# Patient Record
Sex: Female | Born: 1965 | Race: White | Hispanic: No | Marital: Single | Smoking: Never smoker
Health system: Southern US, Community
[De-identification: ages and names within clinical notes are randomized; demographics above are authoritative.]

---

## 2007-08-05 ENCOUNTER — Emergency Department: Payer: Self-pay | Admitting: Emergency Medicine

## 2009-07-21 ENCOUNTER — Emergency Department: Payer: Self-pay | Admitting: Emergency Medicine

## 2009-07-22 ENCOUNTER — Ambulatory Visit: Payer: Self-pay | Admitting: Family Medicine

## 2012-04-21 ENCOUNTER — Emergency Department: Payer: Self-pay | Admitting: Unknown Physician Specialty

## 2017-12-27 ENCOUNTER — Encounter: Payer: Self-pay | Admitting: Internal Medicine

## 2017-12-27 ENCOUNTER — Inpatient Hospital Stay: Payer: Medicaid Other | Attending: Internal Medicine | Admitting: Internal Medicine

## 2017-12-27 ENCOUNTER — Other Ambulatory Visit: Payer: Self-pay | Admitting: *Deleted

## 2017-12-27 DIAGNOSIS — N920 Excessive and frequent menstruation with regular cycle: Secondary | ICD-10-CM | POA: Diagnosis not present

## 2017-12-27 DIAGNOSIS — D500 Iron deficiency anemia secondary to blood loss (chronic): Secondary | ICD-10-CM

## 2017-12-27 DIAGNOSIS — Z79899 Other long term (current) drug therapy: Secondary | ICD-10-CM | POA: Diagnosis not present

## 2017-12-27 LAB — URINALYSIS, COMPLETE (UACMP) WITH MICROSCOPIC
BACTERIA UA: NONE SEEN
BILIRUBIN URINE: NEGATIVE
Glucose, UA: NEGATIVE mg/dL
Hgb urine dipstick: NEGATIVE
KETONES UR: NEGATIVE mg/dL
LEUKOCYTES UA: NEGATIVE
Nitrite: NEGATIVE
Protein, ur: NEGATIVE mg/dL
RBC / HPF: NONE SEEN RBC/hpf (ref 0–5)
Specific Gravity, Urine: 1.002 — ABNORMAL LOW (ref 1.005–1.030)
WBC UA: NONE SEEN WBC/hpf (ref 0–5)
pH: 6 (ref 5.0–8.0)

## 2017-12-27 NOTE — Assessment & Plan Note (Addendum)
#  Iron deficiency anemia; hemoglobin 8.3 MCV 73 platelets 620; ferritin 5 [labs through PCP].  The etiology is unclear [see discussion below].  Recommend weekly Venofer x4.  Discussed the potential infusion reactions with Venofer.   #The etiology of iron deficiency is unclear-recommend GI evaluation; also recommend checking a UA-to rule out urinary causes.  Also check LDH heptoglobin reticulocyte count at next visit.-   #Recommend follow-up in 2 months; ldh; retic; cmp in 2 months; hapto; retic; Venofer possible.  Thank you Dr.Aycock for allowing me to participate in the care of your pleasant patient. Please do not hesitate to contact me with questions or concerns in the interim.  Cc: Dr.Aycock-

## 2017-12-27 NOTE — Progress Notes (Signed)
[name] CONSULT NOTE  Patient Care Team: Patient, No Pcp Per as PCP - General (General Practice)  CHIEF COMPLAINTS/PURPOSE OF CONSULTATION: Iron deficiency anemia  # Iron deficiency Anemia: [hb8.3/ ferritin- 5 /PCP]    No history exists.     HISTORY OF PRESENTING ILLNESS:  Jasmin Molina 52 y.o.  female  with no significant past medical history has been referred to [place] for further evaluation recommendations for iron deficiency anemia.  Patient states that she had anemia "for a long time".  This has previously been attributed to her heavy menstrual periods in the past.  Patient has been intolerant to p.o. iron causing abdominal discomfort.   Patient however more recently states her menstrual cycles have been irregular/infrequent.  She has had 2 menstrual cycles in the last 6 months.   Patient denies any blood in stools; she denies any black colored stools.  She does complain of fatigue.  She does have craving for ice.   ROS: A complete 10 point review of system is done which is negative except mentioned above in history of present illness  MEDICAL HISTORY:  History reviewed. No pertinent past medical history.  SURGICAL HISTORY: History reviewed. No pertinent surgical history.  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  Social History   Socioeconomic History  . Marital status: Single    Spouse name: Not on file  . Number of children: Not on file  . Years of education: Not on file  . Highest education level: Not on file  Occupational History  . Not on file  Social Needs  . Financial resource strain: Not hard at all  . Food insecurity:    Worry: Never true    Inability: Never true  . Transportation needs:    Medical: No    Non-medical: No  Tobacco Use  . Smoking status: Never Smoker  . Smokeless tobacco: Never Used  Substance and Sexual Activity  . Alcohol use: No    Frequency: Never  . Drug use: No  .  Sexual activity: Not on file  Lifestyle  . Physical activity:    Days per week: Not on file    Minutes per session: Not on file  . Stress: Rather much  Relationships  . Social connections:    Talks on phone: Not on file    Gets together: Not on file    Attends religious service: Not on file    Active member of club or organization: Not on file    Attends meetings of clubs or organizations: Not on file    Relationship status: Not on file  . Intimate partner violence:    Fear of current or ex partner: Not on file    Emotionally abused: Not on file    Physically abused: Not on file    Forced sexual activity: Not on file  Other Topics Concern  . Not on file  Social History Narrative  . Not on file    FAMILY HISTORY: no other cancers Family History  Problem Relation Age of Onset  . Cancer Maternal Uncle        Prostate cancer  . Cancer Paternal Uncle        Prostate cancer    ALLERGIES:  is allergic to codeine.  MEDICATIONS:  Current Outpatient Medications  Medication Sig Dispense Refill  . cyanocobalamin (,VITAMIN B-12,) 1000 MCG/ML injection Inject 1,000 mcg into the muscle once.    Marland Kitchen ibuprofen (ADVIL,MOTRIN) 800 MG tablet  Take 800 mg by mouth as needed.    Marland Kitchen. levothyroxine (SYNTHROID, LEVOTHROID) 150 MCG tablet Take 150 mcg by mouth daily before breakfast.     No current facility-administered medications for this visit.       Marland Kitchen.  PHYSICAL EXAMINATION: ECOG PERFORMANCE STATUS: 0 - Asymptomatic  Vitals:   12/27/17 1400  BP: 135/81  Pulse: 85  Resp: 16  Temp: 98.6 F (37 C)   Filed Weights   12/27/17 1400  Weight: 194 lb 3.2 oz (88.1 kg)    GENERAL: Well-nourished well-developed; Alert, no distress and comfortable.   Alone.  EYES: no pallor or icterus OROPHARYNX: no thrush or ulceration; good dentition  NECK: supple, no masses felt LYMPH:  no palpable lymphadenopathy in the cervical, axillary or inguinal regions LUNGS: clear to auscultation and  No  wheeze or crackles HEART/CVS: regular rate & rhythm and no murmurs; No lower extremity edema ABDOMEN: abdomen soft, non-tender and normal bowel sounds Musculoskeletal:no cyanosis of digits and no clubbing  PSYCH: alert & oriented x 3 with fluent speech NEURO: no focal motor/sensory deficits SKIN:  no rashes or significant lesions  LABORATORY DATA:  I have reviewed the data as listed No results found for: WBC, HGB, HCT, MCV, PLT No results for input(s): NA, K, CL, CO2, GLUCOSE, BUN, CREATININE, CALCIUM, GFRNONAA, GFRAA, PROT, ALBUMIN, AST, ALT, ALKPHOS, BILITOT, BILIDIR, IBILI in the last 8760 hours.  RADIOGRAPHIC STUDIES: I have personally reviewed the radiological images as listed and agreed with the findings in the report. No results found.  ASSESSMENT & PLAN:   Iron deficiency anemia due to chronic blood loss #Iron deficiency anemia; hemoglobin 8.3 MCV 73 platelets 620; ferritin 5 [labs through PCP].  The etiology is unclear [see discussion below].  Recommend weekly Venofer x4.  Discussed the potential infusion reactions with Venofer.   #The etiology of iron deficiency is unclear-recommend GI evaluation; also recommend checking a UA-to rule out urinary causes.  Also check LDH heptoglobin reticulocyte count at next visit.-   #Recommend follow-up in 2 months; ldh; retic; cmp in 2 months; hapto; retic; Venofer possible.  Thank you Dr.Aycock for allowing me to participate in the care of your pleasant patient. Please do not hesitate to contact me with questions or concerns in the interim.  Cc: Dr.Aycock-    All questions were answered. The patient knows to call the clinic with any problems, questions or concerns.    Earna CoderGovinda R Oneida Mckamey, MD 01/07/2018 4:28 PM

## 2018-01-03 ENCOUNTER — Inpatient Hospital Stay: Payer: Medicaid Other

## 2018-01-03 VITALS — BP 118/80 | HR 86 | Resp 18

## 2018-01-03 DIAGNOSIS — D500 Iron deficiency anemia secondary to blood loss (chronic): Secondary | ICD-10-CM

## 2018-01-03 MED ORDER — SODIUM CHLORIDE 0.9 % IV SOLN
Freq: Once | INTRAVENOUS | Status: AC
  Administered 2018-01-03: 15:00:00 via INTRAVENOUS
  Filled 2018-01-03: qty 1000

## 2018-01-03 MED ORDER — IRON SUCROSE 20 MG/ML IV SOLN
200.0000 mg | Freq: Once | INTRAVENOUS | Status: AC
  Administered 2018-01-03: 200 mg via INTRAVENOUS
  Filled 2018-01-03: qty 10

## 2018-01-10 ENCOUNTER — Inpatient Hospital Stay: Payer: Medicaid Other | Attending: Internal Medicine

## 2018-01-10 VITALS — BP 128/78 | HR 64 | Temp 98.2°F | Resp 18

## 2018-01-10 DIAGNOSIS — N920 Excessive and frequent menstruation with regular cycle: Secondary | ICD-10-CM | POA: Diagnosis not present

## 2018-01-10 DIAGNOSIS — D500 Iron deficiency anemia secondary to blood loss (chronic): Secondary | ICD-10-CM | POA: Diagnosis present

## 2018-01-10 DIAGNOSIS — Z79899 Other long term (current) drug therapy: Secondary | ICD-10-CM | POA: Insufficient documentation

## 2018-01-10 MED ORDER — IRON SUCROSE 20 MG/ML IV SOLN
200.0000 mg | Freq: Once | INTRAVENOUS | Status: AC
  Administered 2018-01-10: 200 mg via INTRAVENOUS
  Filled 2018-01-10: qty 10

## 2018-01-10 MED ORDER — SODIUM CHLORIDE 0.9 % IV SOLN
Freq: Once | INTRAVENOUS | Status: AC
  Administered 2018-01-10: 15:00:00 via INTRAVENOUS
  Filled 2018-01-10: qty 1000

## 2018-01-17 ENCOUNTER — Inpatient Hospital Stay: Payer: Medicaid Other

## 2018-01-17 VITALS — BP 122/84 | HR 77 | Temp 99.0°F | Resp 18

## 2018-01-17 DIAGNOSIS — D500 Iron deficiency anemia secondary to blood loss (chronic): Secondary | ICD-10-CM

## 2018-01-17 MED ORDER — IRON SUCROSE 20 MG/ML IV SOLN
200.0000 mg | Freq: Once | INTRAVENOUS | Status: AC
  Administered 2018-01-17: 200 mg via INTRAVENOUS
  Filled 2018-01-17: qty 10

## 2018-01-17 MED ORDER — SODIUM CHLORIDE 0.9 % IV SOLN
Freq: Once | INTRAVENOUS | Status: AC
Start: 2018-01-17 — End: 2018-01-17
  Administered 2018-01-17: 15:00:00 via INTRAVENOUS
  Filled 2018-01-17: qty 1000

## 2018-01-24 ENCOUNTER — Other Ambulatory Visit: Payer: Self-pay | Admitting: *Deleted

## 2018-01-24 ENCOUNTER — Inpatient Hospital Stay: Payer: Medicaid Other

## 2018-01-24 ENCOUNTER — Telehealth: Payer: Self-pay | Admitting: *Deleted

## 2018-01-24 VITALS — BP 110/74 | HR 64 | Temp 98.1°F | Resp 18

## 2018-01-24 DIAGNOSIS — D500 Iron deficiency anemia secondary to blood loss (chronic): Secondary | ICD-10-CM

## 2018-01-24 DIAGNOSIS — D509 Iron deficiency anemia, unspecified: Secondary | ICD-10-CM

## 2018-01-24 LAB — CBC WITH DIFFERENTIAL/PLATELET
BASOS PCT: 1 %
Basophils Absolute: 0.1 10*3/uL (ref 0–0.1)
EOS ABS: 0.3 10*3/uL (ref 0–0.7)
Eosinophils Relative: 3 %
HEMATOCRIT: 29.8 % — AB (ref 35.0–47.0)
Hemoglobin: 9.5 g/dL — ABNORMAL LOW (ref 12.0–16.0)
LYMPHS ABS: 1.8 10*3/uL (ref 1.0–3.6)
Lymphocytes Relative: 21 %
MCH: 24.8 pg — AB (ref 26.0–34.0)
MCHC: 31.9 g/dL — AB (ref 32.0–36.0)
MCV: 77.5 fL — ABNORMAL LOW (ref 80.0–100.0)
MONO ABS: 0.5 10*3/uL (ref 0.2–0.9)
MONOS PCT: 6 %
Neutro Abs: 5.8 10*3/uL (ref 1.4–6.5)
Neutrophils Relative %: 69 %
Platelets: 600 10*3/uL — ABNORMAL HIGH (ref 150–440)
RBC: 3.85 MIL/uL (ref 3.80–5.20)
RDW: 25.9 % — AB (ref 11.5–14.5)
WBC: 8.5 10*3/uL (ref 3.6–11.0)

## 2018-01-24 MED ORDER — IRON SUCROSE 20 MG/ML IV SOLN
200.0000 mg | Freq: Once | INTRAVENOUS | Status: AC
  Administered 2018-01-24: 200 mg via INTRAVENOUS
  Filled 2018-01-24: qty 10

## 2018-01-24 MED ORDER — SODIUM CHLORIDE 0.9 % IV SOLN
Freq: Once | INTRAVENOUS | Status: AC
  Administered 2018-01-24: 14:00:00 via INTRAVENOUS
  Filled 2018-01-24: qty 1000

## 2018-01-24 NOTE — Telephone Encounter (Signed)
Patient came to clinic today for iv iron infusion. Requested labs to be drawn to determine if any further iv iron tx are needed. Labs drawn- cbc per md order. hgb demonstrates improvement to 9.3. Patient informed per md order to keep apts as directed in May.

## 2018-02-27 ENCOUNTER — Other Ambulatory Visit: Payer: Self-pay | Admitting: Internal Medicine

## 2018-02-27 DIAGNOSIS — D500 Iron deficiency anemia secondary to blood loss (chronic): Secondary | ICD-10-CM

## 2018-02-28 ENCOUNTER — Encounter: Payer: Self-pay | Admitting: Internal Medicine

## 2018-02-28 ENCOUNTER — Other Ambulatory Visit: Payer: Self-pay

## 2018-02-28 ENCOUNTER — Other Ambulatory Visit: Payer: Self-pay | Admitting: *Deleted

## 2018-02-28 ENCOUNTER — Inpatient Hospital Stay (HOSPITAL_BASED_OUTPATIENT_CLINIC_OR_DEPARTMENT_OTHER): Payer: Medicaid Other | Admitting: Internal Medicine

## 2018-02-28 ENCOUNTER — Inpatient Hospital Stay: Payer: Medicaid Other

## 2018-02-28 ENCOUNTER — Inpatient Hospital Stay: Payer: Medicaid Other | Attending: Internal Medicine

## 2018-02-28 VITALS — BP 121/75 | HR 50 | Temp 98.1°F | Resp 20 | Ht 66.0 in | Wt 183.0 lb

## 2018-02-28 DIAGNOSIS — D500 Iron deficiency anemia secondary to blood loss (chronic): Secondary | ICD-10-CM | POA: Insufficient documentation

## 2018-02-28 LAB — CBC WITH DIFFERENTIAL/PLATELET
BASOS ABS: 0.1 10*3/uL (ref 0–0.1)
Basophils Relative: 1 %
EOS ABS: 0.2 10*3/uL (ref 0–0.7)
Eosinophils Relative: 2 %
HCT: 36.4 % (ref 35.0–47.0)
HEMOGLOBIN: 12 g/dL (ref 12.0–16.0)
Lymphocytes Relative: 25 %
Lymphs Abs: 1.7 10*3/uL (ref 1.0–3.6)
MCH: 27.1 pg (ref 26.0–34.0)
MCHC: 32.9 g/dL (ref 32.0–36.0)
MCV: 82.5 fL (ref 80.0–100.0)
MONOS PCT: 6 %
Monocytes Absolute: 0.4 10*3/uL (ref 0.2–0.9)
NEUTROS ABS: 4.5 10*3/uL (ref 1.4–6.5)
NEUTROS PCT: 66 %
Platelets: 416 10*3/uL (ref 150–440)
RBC: 4.42 MIL/uL (ref 3.80–5.20)
RDW: 22.8 % — ABNORMAL HIGH (ref 11.5–14.5)
WBC: 6.9 10*3/uL (ref 3.6–11.0)

## 2018-02-28 LAB — COMPREHENSIVE METABOLIC PANEL
ALK PHOS: 67 U/L (ref 38–126)
ALT: 14 U/L (ref 14–54)
ANION GAP: 10 (ref 5–15)
AST: 13 U/L — ABNORMAL LOW (ref 15–41)
Albumin: 4.2 g/dL (ref 3.5–5.0)
BILIRUBIN TOTAL: 1 mg/dL (ref 0.3–1.2)
BUN: 12 mg/dL (ref 6–20)
CALCIUM: 9.1 mg/dL (ref 8.9–10.3)
CO2: 21 mmol/L — ABNORMAL LOW (ref 22–32)
Chloride: 105 mmol/L (ref 101–111)
Creatinine, Ser: 1.01 mg/dL — ABNORMAL HIGH (ref 0.44–1.00)
GFR calc Af Amer: >60 mL/min (ref >60)
Glucose, Bld: 89 mg/dL (ref 65–99)
POTASSIUM: 3.7 mmol/L (ref 3.5–5.1)
Sodium: 136 mmol/L (ref 135–145)
TOTAL PROTEIN: 7.9 g/dL (ref 6.5–8.1)

## 2018-02-28 LAB — RETICULOCYTES
RBC.: 4.41 MIL/uL (ref 3.80–5.20)
RETIC CT PCT: 0.5 % (ref 0.4–3.1)
Retic Count, Absolute: 22.1 10*3/uL (ref 19.0–183.0)

## 2018-02-28 LAB — LACTATE DEHYDROGENASE: LDH: 149 U/L (ref 98–192)

## 2018-02-28 MED ORDER — IRON SUCROSE 20 MG/ML IV SOLN
200.0000 mg | Freq: Once | INTRAVENOUS | Status: AC
  Administered 2018-02-28: 200 mg via INTRAVENOUS
  Filled 2018-02-28: qty 10

## 2018-02-28 MED ORDER — SODIUM CHLORIDE 0.9 % IV SOLN
Freq: Once | INTRAVENOUS | Status: AC
  Administered 2018-02-28: 16:00:00 via INTRAVENOUS
  Filled 2018-02-28: qty 1000

## 2018-02-28 NOTE — Assessment & Plan Note (Addendum)
#  Iron deficiency anemia; hemoglobin 8.3 MCV 73 platelets 620; ferritin 5 [labs through PCP].  Status post weekly Venofer x4-hemoglobin improved on 12.  Recommend 1 more IV iron infusion today.  Urinalysis normal.  #The etiology of iron deficiency is unclear-will refer to GI [place].   # follow up in 3 months; cbc/vneofer possible; IV venofer today.  Awaiting on LDH/haptoglobin  Cc: Dr.Aycock-

## 2018-02-28 NOTE — Progress Notes (Signed)
[facility] CONSULT NOTE  Patient Care Team: Patient, No Pcp Per as PCP - General (General Practice)  CHIEF COMPLAINTS/PURPOSE OF CONSULTATION: Iron deficiency anemia  # Iron deficiency Anemia: [hb8.3/ ferritin- 5 /PCP]    No history exists.     HISTORY OF PRESENTING ILLNESS:  Jasmin Molina 52 y.o.  female with a history of iron deficient anemia is here for follow-up.  Patient is status post for IV iron infusions.   Her energy levels have improved/fatigue not complete resolved.  She has history of previous heavy menstrual cycles; not so heavy in the last 2 months.  No blood in stools or black or stools.  Review of Systems  Constitutional: Positive for malaise/fatigue. Negative for chills, diaphoresis, fever and weight loss.  HENT: Negative for nosebleeds and sore throat.   Eyes: Negative for double vision.  Respiratory: Negative for cough, hemoptysis, sputum production, shortness of breath and wheezing.   Cardiovascular: Negative for chest pain, palpitations, orthopnea and leg swelling.  Gastrointestinal: Negative for abdominal pain, blood in stool, constipation, diarrhea, heartburn, melena, nausea and vomiting.  Genitourinary: Negative for dysuria, frequency and urgency.  Musculoskeletal: Negative for back pain and joint pain.  Skin: Negative.  Negative for itching and rash.  Neurological: Negative for dizziness, tingling, focal weakness, weakness and headaches.  Endo/Heme/Allergies: Does not bruise/bleed easily.  Psychiatric/Behavioral: Negative for depression. The patient is not nervous/anxious and does not have insomnia.       ROS: A complete 10 point review of system is done which is negative except mentioned above in history of present illness  MEDICAL HISTORY:  History reviewed. No pertinent past medical history.  SURGICAL HISTORY: History reviewed. No pertinent surgical history.  SOCIAL HISTORY:  Social History   Socioeconomic History  .  Marital status: Single    Spouse name: Not on file  . Number of children: Not on file  . Years of education: Not on file  . Highest education level: Not on file  Occupational History  . Not on file  Social Needs  . Financial resource strain: Not hard at all  . Food insecurity:    Worry: Never true    Inability: Never true  . Transportation needs:    Medical: No    Non-medical: No  Tobacco Use  . Smoking status: Never Smoker  . Smokeless tobacco: Never Used  Substance and Sexual Activity  . Alcohol use: No    Frequency: Never  . Drug use: No  . Sexual activity: Not on file  Lifestyle  . Physical activity:    Days per week: Not on file    Minutes per session: Not on file  . Stress: Rather much  Relationships  . Social connections:    Talks on phone: Not on file    Gets together: Not on file    Attends religious service: Not on file    Active member of club or organization: Not on file    Attends meetings of clubs or organizations: Not on file    Relationship status: Not on file  . Intimate partner violence:    Fear of current or ex partner: Not on file    Emotionally abused: Not on file    Physically abused: Not on file    Forced sexual activity: Not on file  Other Topics Concern  . 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     FAMILY HISTORY: no other  cancers Family History  Problem Relation Age of Onset  . Cancer Maternal Uncle        Prostate cancer  . Cancer Paternal Uncle        Prostate cancer    ALLERGIES:  is allergic to codeine.  MEDICATIONS:  Current Outpatient Medications  Medication Sig Dispense Refill  . cyanocobalamin (,VITAMIN B-12,) 1000 MCG/ML injection Inject 1,000 mcg into the muscle once.    Marland Kitchen ibuprofen (ADVIL,MOTRIN) 800 MG tablet Take 800 mg by mouth as needed.    Marland Kitchen levothyroxine (SYNTHROID, LEVOTHROID) 150 MCG tablet Take 150 mcg by mouth daily before breakfast.     No  current facility-administered medications for this visit.       Marland Kitchen  PHYSICAL EXAMINATION: ECOG PERFORMANCE STATUS: 0 - Asymptomatic  Vitals:   02/28/18 1430  BP: 121/75  Pulse: (!) 50  Resp: 20  Temp: 98.1 F (36.7 C)   Filed Weights   02/28/18 1429  Weight: 183 lb (83 kg)    GENERAL: Well-nourished well-developed; Alert, no distress and comfortable.   Alone.  EYES: no pallor or icterus OROPHARYNX: no thrush or ulceration; good dentition  NECK: supple, no masses felt LYMPH:  no palpable lymphadenopathy in the cervical, axillary or inguinal regions LUNGS: clear to auscultation and  No wheeze or crackles HEART/CVS: regular rate & rhythm and no murmurs; No lower extremity edema ABDOMEN: abdomen soft, non-tender and normal bowel sounds Musculoskeletal:no cyanosis of digits and no clubbing  PSYCH: alert & oriented x 3 with fluent speech NEURO: no focal motor/sensory deficits SKIN:  no rashes or significant lesions  LABORATORY DATA:  I have reviewed the data as listed Lab Results  Component Value Date   WBC 6.9 02/28/2018   HGB 12.0 02/28/2018   HCT 36.4 02/28/2018   MCV 82.5 02/28/2018   PLT 416 02/28/2018   Recent Labs    02/28/18 1406  NA 136  K 3.7  CL 105  CO2 21*  GLUCOSE 89  BUN 12  CREATININE 1.01*  CALCIUM 9.1  GFRNONAA >60  GFRAA >60  PROT 7.9  ALBUMIN 4.2  AST 13*  ALT 14  ALKPHOS 67  BILITOT 1.0    RADIOGRAPHIC STUDIES: I have personally reviewed the radiological images as listed and agreed with the findings in the report. No results found.  ASSESSMENT & PLAN:   Iron deficiency anemia due to chronic blood loss #Iron deficiency anemia; hemoglobin 8.3 MCV 73 platelets 620; ferritin 5 [labs through PCP].  Status post weekly Venofer x4-hemoglobin improved on 12.  Recommend 1 more IV iron infusion today.  Urinalysis normal.  #The etiology of iron deficiency is unclear-will refer to GI [place].   # follow up in 3 months; cbc/vneofer  possible; IV venofer today.  Awaiting on LDH/haptoglobin  Cc: Dr.Aycock-    All questions were answered. The patient knows to call the clinic with any problems, questions or concerns.    Earna Coder, MD 02/28/2018 5:02 PM

## 2018-03-01 LAB — HAPTOGLOBIN: Haptoglobin: 176 mg/dL (ref 34–200)

## 2018-04-02 ENCOUNTER — Encounter: Payer: Medicaid Other | Admitting: Gastroenterology

## 2018-04-02 ENCOUNTER — Encounter: Payer: Self-pay | Admitting: *Deleted

## 2018-04-02 NOTE — Progress Notes (Signed)
error 

## 2018-05-30 ENCOUNTER — Inpatient Hospital Stay: Payer: Self-pay | Attending: Internal Medicine

## 2018-05-30 ENCOUNTER — Inpatient Hospital Stay (HOSPITAL_BASED_OUTPATIENT_CLINIC_OR_DEPARTMENT_OTHER): Payer: Self-pay | Admitting: Internal Medicine

## 2018-05-30 ENCOUNTER — Inpatient Hospital Stay: Payer: Self-pay

## 2018-05-30 ENCOUNTER — Encounter: Payer: Self-pay | Admitting: Internal Medicine

## 2018-05-30 ENCOUNTER — Other Ambulatory Visit: Payer: Self-pay

## 2018-05-30 VITALS — BP 114/80 | HR 68 | Temp 98.6°F | Resp 20 | Ht 66.0 in | Wt 177.0 lb

## 2018-05-30 VITALS — BP 114/76 | HR 72 | Temp 98.7°F | Resp 18

## 2018-05-30 DIAGNOSIS — Z79899 Other long term (current) drug therapy: Secondary | ICD-10-CM | POA: Insufficient documentation

## 2018-05-30 DIAGNOSIS — R5383 Other fatigue: Secondary | ICD-10-CM

## 2018-05-30 DIAGNOSIS — D500 Iron deficiency anemia secondary to blood loss (chronic): Secondary | ICD-10-CM | POA: Insufficient documentation

## 2018-05-30 LAB — CBC WITH DIFFERENTIAL/PLATELET
BASOS ABS: 0 10*3/uL (ref 0–0.1)
Basophils Relative: 0 %
Eosinophils Absolute: 0.2 10*3/uL (ref 0–0.7)
Eosinophils Relative: 2 %
HCT: 36.4 % (ref 35.0–47.0)
HEMOGLOBIN: 12.3 g/dL (ref 12.0–16.0)
Lymphocytes Relative: 16 %
Lymphs Abs: 1.7 10*3/uL (ref 1.0–3.6)
MCH: 29.9 pg (ref 26.0–34.0)
MCHC: 33.8 g/dL (ref 32.0–36.0)
MCV: 88.3 fL (ref 80.0–100.0)
Monocytes Absolute: 0.6 10*3/uL (ref 0.2–0.9)
Monocytes Relative: 6 %
NEUTROS PCT: 76 %
Neutro Abs: 8.2 10*3/uL — ABNORMAL HIGH (ref 1.4–6.5)
Platelets: 445 10*3/uL — ABNORMAL HIGH (ref 150–440)
RBC: 4.13 MIL/uL (ref 3.80–5.20)
RDW: 15.1 % — ABNORMAL HIGH (ref 11.5–14.5)
WBC: 10.8 10*3/uL (ref 3.6–11.0)

## 2018-05-30 MED ORDER — IRON SUCROSE 20 MG/ML IV SOLN
200.0000 mg | Freq: Once | INTRAVENOUS | Status: AC
  Administered 2018-05-30: 200 mg via INTRAVENOUS
  Filled 2018-05-30: qty 10

## 2018-05-30 MED ORDER — SODIUM CHLORIDE 0.9 % IV SOLN
Freq: Once | INTRAVENOUS | Status: AC
  Administered 2018-05-30: 14:00:00 via INTRAVENOUS
  Filled 2018-05-30: qty 250

## 2018-05-30 MED ORDER — SODIUM CHLORIDE 0.9 % IV SOLN: 200.0000 mg | Freq: Once | INTRAVENOUS | Status: DC

## 2018-05-30 NOTE — Progress Notes (Signed)
[facility] CONSULT NOTE  Patient Care Team: Patient, No Pcp Per as PCP - General (General Practice)  CHIEF COMPLAINTS/PURPOSE OF CONSULTATION: Iron deficiency anemia  # Iron deficiency Anemia: [hb8.3/ ferritin- 5 /PCP] s/p IV venofer; awaiting GI eval,     No history exists.     HISTORY OF PRESENTING ILLNESS:  Jasmin Molina 52 y.o.  female with a history of iron deficient anemia is here for follow-up.    Patient status post IV iron infusion x5; improvement of her energy levels.  However continues to feel fatigue now.  No blood in stools black stools.  Patient has not followed up with GI yet.  States that she missed a phone call from the GI office.  Review of Systems  Constitutional: Positive for malaise/fatigue. Negative for chills, diaphoresis, fever and weight loss.  HENT: Negative for nosebleeds and sore throat.   Eyes: Negative for double vision.  Respiratory: Negative for cough, hemoptysis, sputum production, shortness of breath and wheezing.   Cardiovascular: Negative for chest pain, palpitations, orthopnea and leg swelling.  Gastrointestinal: Negative for abdominal pain, blood in stool, constipation, diarrhea, heartburn, melena, nausea and vomiting.  Genitourinary: Negative for dysuria, frequency and urgency.  Musculoskeletal: Negative for back pain and joint pain.  Skin: Negative.  Negative for itching and rash.  Neurological: Negative for dizziness, tingling, focal weakness, weakness and headaches.  Endo/Heme/Allergies: Does not bruise/bleed easily.  Psychiatric/Behavioral: Negative for depression. The patient is not nervous/anxious and does not have insomnia.       ROS: A complete 10 point review of system is done which is negative except mentioned above in history of present illness  MEDICAL HISTORY:  History reviewed. No pertinent past medical history.  SURGICAL HISTORY: History reviewed. No pertinent surgical history.  SOCIAL HISTORY:   Social History   Socioeconomic History  . Marital status: Single    Spouse name: Not on file  . Number of children: Not on file  . Years of education: Not on file  . Highest education level: Not on file  Occupational History  . Not on file  Social Needs  . Financial resource strain: Not hard at all  . Food insecurity:    Worry: Never true    Inability: Never true  . Transportation needs:    Medical: No    Non-medical: No  Tobacco Use  . Smoking status: Never Smoker  . Smokeless tobacco: Never Used  Substance and Sexual Activity  . Alcohol use: No    Frequency: Never  . Drug use: No  . Sexual activity: Not on file  Lifestyle  . Physical activity:    Days per week: Not on file    Minutes per session: Not on file  . Stress: Rather much  Relationships  . Social connections:    Talks on phone: Not on file    Gets together: Not on file    Attends religious service: Not on file    Active member of club or organization: Not on file    Attends meetings of clubs or organizations: Not on file    Relationship status: Not on file  . Intimate partner violence:    Fear of current or ex partner: Not on file    Emotionally abused: Not on file    Physically abused: Not on file    Forced sexual activity: Not on file  Other Topics Concern  . 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     FAMILY HISTORY: no other cancers Family History  Problem Relation Age of Onset  . Cancer Maternal Uncle        Prostate cancer  . Cancer Paternal Uncle        Prostate cancer    ALLERGIES:  is allergic to codeine.  MEDICATIONS:  Current Outpatient Medications  Medication Sig Dispense Refill  . cyanocobalamin (,VITAMIN B-12,) 1000 MCG/ML injection Inject 1,000 mcg into the muscle once.    Marland Kitchen. ibuprofen (ADVIL,MOTRIN) 800 MG tablet Take 800 mg by mouth as needed.    Marland Kitchen. levothyroxine (SYNTHROID, LEVOTHROID) 150 MCG tablet Take 150  mcg by mouth daily before breakfast.     No current facility-administered medications for this visit.       Marland Kitchen.  PHYSICAL EXAMINATION: ECOG PERFORMANCE STATUS: 0 - Asymptomatic  Vitals:   05/30/18 1315  BP: 114/80  Pulse: 68  Resp: 20  Temp: 98.6 F (37 C)   Filed Weights   05/30/18 1322  Weight: 177 lb (80.3 kg)    Physical Exam  Constitutional: She is oriented to person, place, and time and well-developed, well-nourished, and in no distress.  HENT:  Head: Normocephalic and atraumatic.  Mouth/Throat: Oropharynx is clear and moist. No oropharyngeal exudate.  Eyes: Pupils are equal, round, and reactive to light.  Neck: Normal range of motion. Neck supple.  Cardiovascular: Normal rate and regular rhythm.  Pulmonary/Chest: No respiratory distress. She has no wheezes.  Abdominal: Soft. Bowel sounds are normal. She exhibits no distension and no mass. There is no tenderness. There is no rebound and no guarding.  Musculoskeletal: Normal range of motion. She exhibits no edema or tenderness.  Neurological: She is alert and oriented to person, place, and time.  Skin: Skin is warm.  Psychiatric: Affect normal.     LABORATORY DATA:  I have reviewed the data as listed Lab Results  Component Value Date   WBC 10.8 05/30/2018   HGB 12.3 05/30/2018   HCT 36.4 05/30/2018   MCV 88.3 05/30/2018   PLT 445 (H) 05/30/2018   Recent Labs    02/28/18 1406  NA 136  K 3.7  CL 105  CO2 21*  GLUCOSE 89  BUN 12  CREATININE 1.01*  CALCIUM 9.1  GFRNONAA >60  GFRAA >60  PROT 7.9  ALBUMIN 4.2  AST 13*  ALT 14  ALKPHOS 67  BILITOT 1.0    RADIOGRAPHIC STUDIES: I have personally reviewed the radiological images as listed and agreed with the findings in the report. No results found.  ASSESSMENT & PLAN:   Iron deficiency anemia due to chronic blood loss #Iron deficiency anemia; hemoglobin 8.3 MCV 73 platelets 620; ferritin 5 [labs through PCP]. S/p IV iron- Improved- HB-12.3. IV  iron today as patient continues to feel fatigue.  #The etiology of iron deficiency is unclear-will refer back to GI [place].   # follow up in 4 months/labs;Posisble iV iron. IV iron today.   Cc: Dr.Aycock-.   All questions were answered. The patient knows to call the clinic with any problems, questions or concerns.    Earna CoderGovinda R Maeven Mcdougall, MD 05/30/2018 1:42 PM

## 2018-05-30 NOTE — Assessment & Plan Note (Addendum)
#  Iron deficiency anemia; hemoglobin 8.3 MCV 73 platelets 620; ferritin 5 [labs through PCP]. S/p IV iron- Improved- HB-12.3. IV iron today as patient continues to feel fatigue.  #The etiology of iron deficiency is unclear-will refer back to GI [place].   # follow up in 4 months/labs;Posisble iV iron. IV iron today.   Cc: Dr.Aycock-.

## 2018-05-31 ENCOUNTER — Encounter: Payer: Self-pay | Admitting: Pharmacy Technician

## 2018-05-31 NOTE — Progress Notes (Signed)
Patient has been approved for drug assistance by [employer] for Venofer. The enrollment period is from 05/30/18-05/30/19 based on Medicaid termed. First DOS covered is 05/30/18.

## 2018-09-26 ENCOUNTER — Inpatient Hospital Stay: Payer: Self-pay

## 2018-09-26 ENCOUNTER — Inpatient Hospital Stay: Payer: Self-pay | Admitting: Internal Medicine

## 2018-09-26 NOTE — Assessment & Plan Note (Signed)
#  Iron deficiency anemia; hemoglobin 8.3 MCV 73 platelets 620; ferritin 5 [labs through PCP]. S/p IV iron- Improved- HB-12.3. IV iron today as patient continues to feel fatigue.  #The etiology of iron deficiency is unclear-will refer back to GI [place].   # follow up in 4 months/labs;Posisble iV iron. IV iron today.   Cc: Dr.Aycock-.

## 2018-09-26 NOTE — Progress Notes (Unsigned)
[facility] CONSULT NOTE  Patient Care Team: Patient, No Pcp Per as PCP - General (General Practice)  CHIEF COMPLAINTS/PURPOSE OF CONSULTATION: Iron deficiency anemia  # Iron deficiency Anemia: [hb8.3/ ferritin- 5 /PCP] s/p IV venofer; awaiting GI eval,     No history exists.     HISTORY OF PRESENTING ILLNESS:  Jasmin Molina 52 y.o.  female with a history of iron deficient anemia is here for follow-up.    Patient status post IV iron infusion x5; improvement of her energy levels.  However continues to feel fatigue now.  No blood in stools black stools.  Patient has not followed up with GI yet.  States that she missed a phone call from the GI office.  Review of Systems  Constitutional: Positive for malaise/fatigue. Negative for chills, diaphoresis, fever and weight loss.  HENT: Negative for nosebleeds and sore throat.   Eyes: Negative for double vision.  Respiratory: Negative for cough, hemoptysis, sputum production, shortness of breath and wheezing.   Cardiovascular: Negative for chest pain, palpitations, orthopnea and leg swelling.  Gastrointestinal: Negative for abdominal pain, blood in stool, constipation, diarrhea, heartburn, melena, nausea and vomiting.  Genitourinary: Negative for dysuria, frequency and urgency.  Musculoskeletal: Negative for back pain and joint pain.  Skin: Negative.  Negative for itching and rash.  Neurological: Negative for dizziness, tingling, focal weakness, weakness and headaches.  Endo/Heme/Allergies: Does not bruise/bleed easily.  Psychiatric/Behavioral: Negative for depression. The patient is not nervous/anxious and does not have insomnia.       ROS: A complete 10 point review of system is done which is negative except mentioned above in history of present illness  MEDICAL HISTORY:  No past medical history on file.  SURGICAL HISTORY: No past surgical history on file.  SOCIAL HISTORY:  Social History   Socioeconomic  History  . Marital status: Single    Spouse name: Not on file  . Number of children: Not on file  . Years of education: Not on file  . Highest education level: Not on file  Occupational History  . Not on file  Social Needs  . Financial resource strain: Not hard at all  . Food insecurity:    Worry: Never true    Inability: Never true  . Transportation needs:    Medical: No    Non-medical: No  Tobacco Use  . Smoking status: Never Smoker  . Smokeless tobacco: Never Used  Substance and Sexual Activity  . Alcohol use: No    Frequency: Never  . Drug use: No  . Sexual activity: Not on file  Lifestyle  . Physical activity:    Days per week: Not on file    Minutes per session: Not on file  . Stress: Rather much  Relationships  . Social connections:    Talks on phone: Not on file    Gets together: Not on file    Attends religious service: Not on file    Active member of club or organization: Not on file    Attends meetings of clubs or organizations: Not on file    Relationship status: Not on file  . Intimate partner violence:    Fear of current or ex partner: Not on file    Emotionally abused: Not on file    Physically abused: Not on file    Forced sexual activity: Not on file  Other Topics Concern  . 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     FAMILY HISTORY: no other cancers Family History  Problem Relation Age of Onset  . Cancer Maternal Uncle        Prostate cancer  . Cancer Paternal Uncle        Prostate cancer    ALLERGIES:  is allergic to codeine.  MEDICATIONS:  Current Outpatient Medications  Medication Sig Dispense Refill  . cyanocobalamin (,VITAMIN B-12,) 1000 MCG/ML injection Inject 1,000 mcg into the muscle once.    Marland Kitchen. ibuprofen (ADVIL,MOTRIN) 800 MG tablet Take 800 mg by mouth as needed.    Marland Kitchen. levothyroxine (SYNTHROID, LEVOTHROID) 150 MCG tablet Take 150 mcg by mouth daily before  breakfast.     No current facility-administered medications for this visit.       Marland Kitchen.  PHYSICAL EXAMINATION: ECOG PERFORMANCE STATUS: 0 - Asymptomatic  There were no vitals filed for this visit. There were no vitals filed for this visit.  Physical Exam  Constitutional: She is oriented to person, place, and time and well-developed, well-nourished, and in no distress.  HENT:  Head: Normocephalic and atraumatic.  Mouth/Throat: Oropharynx is clear and moist. No oropharyngeal exudate.  Eyes: Pupils are equal, round, and reactive to light.  Neck: Normal range of motion. Neck supple.  Cardiovascular: Normal rate and regular rhythm.  Pulmonary/Chest: No respiratory distress. She has no wheezes.  Abdominal: Soft. Bowel sounds are normal. She exhibits no distension and no mass. There is no abdominal tenderness. There is no rebound and no guarding.  Musculoskeletal: Normal range of motion.        General: No tenderness or edema.  Neurological: She is alert and oriented to person, place, and time.  Skin: Skin is warm.  Psychiatric: Affect normal.     LABORATORY DATA:  I have reviewed the data as listed Lab Results  Component Value Date   WBC 10.8 05/30/2018   HGB 12.3 05/30/2018   HCT 36.4 05/30/2018   MCV 88.3 05/30/2018   PLT 445 (H) 05/30/2018   Recent Labs    02/28/18 1406  NA 136  K 3.7  CL 105  CO2 21*  GLUCOSE 89  BUN 12  CREATININE 1.01*  CALCIUM 9.1  GFRNONAA >60  GFRAA >60  PROT 7.9  ALBUMIN 4.2  AST 13*  ALT 14  ALKPHOS 67  BILITOT 1.0    RADIOGRAPHIC STUDIES: I have personally reviewed the radiological images as listed and agreed with the findings in the report. No results found.  ASSESSMENT & PLAN:   No problem-specific Assessment & Plan notes found for this encounter.  All questions were answered. The patient knows to call the clinic with any problems, questions or concerns.    Earna CoderGovinda R Vershawn Westrup, MD 09/26/2018 8:18 AM

## 2018-10-29 ENCOUNTER — Inpatient Hospital Stay (HOSPITAL_BASED_OUTPATIENT_CLINIC_OR_DEPARTMENT_OTHER): Payer: Self-pay | Admitting: Internal Medicine

## 2018-10-29 ENCOUNTER — Inpatient Hospital Stay: Payer: Self-pay | Attending: Internal Medicine

## 2018-10-29 ENCOUNTER — Other Ambulatory Visit: Payer: Self-pay

## 2018-10-29 ENCOUNTER — Encounter: Payer: Self-pay | Admitting: Internal Medicine

## 2018-10-29 ENCOUNTER — Telehealth: Payer: Self-pay | Admitting: Internal Medicine

## 2018-10-29 ENCOUNTER — Inpatient Hospital Stay: Payer: Self-pay

## 2018-10-29 VITALS — BP 126/83 | HR 74 | Temp 98.2°F | Resp 20 | Ht 66.0 in

## 2018-10-29 DIAGNOSIS — D500 Iron deficiency anemia secondary to blood loss (chronic): Secondary | ICD-10-CM

## 2018-10-29 DIAGNOSIS — N920 Excessive and frequent menstruation with regular cycle: Secondary | ICD-10-CM

## 2018-10-29 DIAGNOSIS — Z79899 Other long term (current) drug therapy: Secondary | ICD-10-CM

## 2018-10-29 LAB — COMPREHENSIVE METABOLIC PANEL
ALK PHOS: 96 U/L (ref 38–126)
ALT: 23 U/L (ref 0–44)
ANION GAP: 9 (ref 5–15)
AST: 19 U/L (ref 15–41)
Albumin: 4.3 g/dL (ref 3.5–5.0)
BUN: 20 mg/dL (ref 6–20)
CALCIUM: 9 mg/dL (ref 8.9–10.3)
CHLORIDE: 104 mmol/L (ref 98–111)
CO2: 25 mmol/L (ref 22–32)
Creatinine, Ser: 1.01 mg/dL — ABNORMAL HIGH (ref 0.44–1.00)
GLUCOSE: 83 mg/dL (ref 70–99)
Potassium: 4.3 mmol/L (ref 3.5–5.1)
SODIUM: 138 mmol/L (ref 135–145)
Total Bilirubin: 1.2 mg/dL (ref 0.3–1.2)
Total Protein: 8.4 g/dL — ABNORMAL HIGH (ref 6.5–8.1)

## 2018-10-29 LAB — CBC WITH DIFFERENTIAL/PLATELET
Abs Immature Granulocytes: 0.02 10*3/uL (ref 0.00–0.07)
Basophils Absolute: 0.1 10*3/uL (ref 0.0–0.1)
Basophils Relative: 1 %
Eosinophils Absolute: 0.3 10*3/uL (ref 0.0–0.5)
Eosinophils Relative: 4 %
HCT: 40 % (ref 36.0–46.0)
Hemoglobin: 13 g/dL (ref 12.0–15.0)
Immature Granulocytes: 0 %
Lymphocytes Relative: 23 %
Lymphs Abs: 1.9 10*3/uL (ref 0.7–4.0)
MCH: 29.5 pg (ref 26.0–34.0)
MCHC: 32.5 g/dL (ref 30.0–36.0)
MCV: 90.7 fL (ref 80.0–100.0)
Monocytes Absolute: 0.6 10*3/uL (ref 0.1–1.0)
Monocytes Relative: 7 %
NEUTROS PCT: 65 %
Neutro Abs: 5.1 10*3/uL (ref 1.7–7.7)
Platelets: 434 10*3/uL — ABNORMAL HIGH (ref 150–400)
RBC: 4.41 MIL/uL (ref 3.87–5.11)
RDW: 13.4 % (ref 11.5–15.5)
WBC: 8 10*3/uL (ref 4.0–10.5)
nRBC: 0 % (ref 0.0–0.2)

## 2018-10-29 LAB — IRON AND TIBC
IRON: 46 ug/dL (ref 28–170)
Saturation Ratios: 12 % (ref 10.4–31.8)
TIBC: 388 ug/dL (ref 250–450)
UIBC: 342 ug/dL

## 2018-10-29 LAB — FERRITIN: FERRITIN: 8 ng/mL — AB (ref 11–307)

## 2018-10-29 NOTE — Progress Notes (Signed)
[facility] CONSULT NOTE  Patient Care Team: Patient, No Pcp Per as PCP - General (General Practice)  CHIEF COMPLAINTS/PURPOSE OF CONSULTATION: Iron deficiency anemia  #Hx of IDA [>20 years] Iron deficiency Anemia: [hb8.3/ ferritin- 5 /PCP] s/p IV venofer; ? Menorrhagia vs. Declined GI eval.    #     No history exists.     HISTORY OF PRESENTING ILLNESS:  Jasmin Molina 53 y.o.  female with a history of iron deficient anemia is here for follow-up.    Patient complains of mild to moderate fatigue.  She denies any blood in stools or black or stools.  She did not follow-up with GI as recommended.  She currently has not had menstrual.  In the last 2 months.  However previously she had heavy periods.  She does not take iron pills because of intolerance.  Review of Systems  Constitutional: Positive for malaise/fatigue. Negative for chills, diaphoresis, fever and weight loss.  HENT: Negative for nosebleeds and sore throat.   Eyes: Negative for double vision.  Respiratory: Negative for cough, hemoptysis, sputum production, shortness of breath and wheezing.   Cardiovascular: Negative for chest pain, palpitations, orthopnea and leg swelling.  Gastrointestinal: Negative for abdominal pain, blood in stool, constipation, diarrhea, heartburn, melena, nausea and vomiting.  Genitourinary: Negative for dysuria, frequency and urgency.  Musculoskeletal: Negative for back pain and joint pain.  Skin: Negative.  Negative for itching and rash.  Neurological: Negative for dizziness, tingling, focal weakness, weakness and headaches.  Endo/Heme/Allergies: Does not bruise/bleed easily.  Psychiatric/Behavioral: Negative for depression. The patient is not nervous/anxious and does not have insomnia.     MEDICAL HISTORY:  History reviewed. No pertinent past medical history.  SURGICAL HISTORY: History reviewed. No pertinent surgical history.  SOCIAL HISTORY:  Social History    Socioeconomic History  . Marital status: Single    Spouse name: Not on file  . Number of children: Not on file  . Years of education: Not on file  . Highest education level: Not on file  Occupational History  . Not on file  Social Needs  . Financial resource strain: Not hard at all  . Food insecurity:    Worry: Never true    Inability: Never true  . Transportation needs:    Medical: No    Non-medical: No  Tobacco Use  . Smoking status: Never Smoker  . Smokeless tobacco: Never Used  Substance and Sexual Activity  . Alcohol use: No    Frequency: Never  . Drug use: No  . Sexual activity: Not on file  Lifestyle  . Physical activity:    Days per week: Not on file    Minutes per session: Not on file  . Stress: Rather much  Relationships  . Social connections:    Talks on phone: Not on file    Gets together: Not on file    Attends religious service: Not on file    Active member of club or organization: Not on file    Attends meetings of clubs or organizations: Not on file    Relationship status: Not on file  . Intimate partner violence:    Fear of current or ex partner: Not on file    Emotionally abused: Not on file    Physically abused: Not on file    Forced sexual activity: Not on file  Other Topics Concern  . 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     FAMILY HISTORY: no other cancers Family History  Problem Relation Age of Onset  . Cancer Maternal Uncle        Prostate cancer  . Cancer Paternal Uncle        Prostate cancer    ALLERGIES:  is allergic to codeine.  MEDICATIONS:  Current Outpatient Medications  Medication Sig Dispense Refill  . cyanocobalamin (,VITAMIN B-12,) 1000 MCG/ML injection Inject 1,000 mcg into the muscle once.    Marland Kitchen ibuprofen (ADVIL,MOTRIN) 800 MG tablet Take 800 mg by mouth as needed.    Marland Kitchen levothyroxine (SYNTHROID, LEVOTHROID) 150 MCG tablet Take 150 mcg by mouth  daily before breakfast.     No current facility-administered medications for this visit.       Marland Kitchen  PHYSICAL EXAMINATION: ECOG PERFORMANCE STATUS: 0 - Asymptomatic  Vitals:   10/29/18 1315  BP: 126/83  Pulse: 74  Resp: 20  Temp: 98.2 F (36.8 C)   There were no vitals filed for this visit.  Physical Exam  Constitutional: She is oriented to person, place, and time and well-developed, well-nourished, and in no distress.  HENT:  Head: Normocephalic and atraumatic.  Mouth/Throat: Oropharynx is clear and moist. No oropharyngeal exudate.  Eyes: Pupils are equal, round, and reactive to light.  Neck: Normal range of motion. Neck supple.  Cardiovascular: Normal rate and regular rhythm.  Pulmonary/Chest: No respiratory distress. She has no wheezes.  Abdominal: Soft. Bowel sounds are normal. She exhibits no distension and no mass. There is no abdominal tenderness. There is no rebound and no guarding.  Musculoskeletal: Normal range of motion.        General: No tenderness or edema.  Neurological: She is alert and oriented to person, place, and time.  Skin: Skin is warm.  Psychiatric: Affect normal.     LABORATORY DATA:  I have reviewed the data as listed Lab Results  Component Value Date   WBC 8.0 10/29/2018   HGB 13.0 10/29/2018   HCT 40.0 10/29/2018   MCV 90.7 10/29/2018   PLT 434 (H) 10/29/2018   Recent Labs    02/28/18 1406 10/29/18 1240  NA 136 138  K 3.7 4.3  CL 105 104  CO2 21* 25  GLUCOSE 89 83  BUN 12 20  CREATININE 1.01* 1.01*  CALCIUM 9.1 9.0  GFRNONAA >60 >60  GFRAA >60 >60  PROT 7.9 8.4*  ALBUMIN 4.2 4.3  AST 13* 19  ALT 14 23  ALKPHOS 67 96  BILITOT 1.0 1.2    RADIOGRAPHIC STUDIES: I have personally reviewed the radiological images as listed and agreed with the findings in the report. No results found.  ASSESSMENT & PLAN:   Iron deficiency anemia due to chronic blood loss #Iron deficiency anemia; hemoglobin 8.3 MCV 73 platelets 620;  ferritin 5 [labs through PCP]. S/p IV iron- Improved- HB-13; iron studes-pending today.  #The etiology of iron deficiency is unclear-will refer back to GI [place]; does not want any referrals to GI.    # DISPOSITION:  # no infusion today.  # follow up in 6 months/labs-cbcbmp/iron studies/ferrtin;Posisble iV iron-Dr.B.  Addendum: Patient's ferritin is 8; saturation is 12%.  Recommend Venofer next week. Cc: Dr.Aycock-.   All questions were answered. The patient knows to call the clinic with any problems, questions or concerns.    Earna Coder, MD 10/29/2018 3:01 PM

## 2018-10-29 NOTE — Assessment & Plan Note (Addendum)
#  Iron deficiency anemia; hemoglobin 8.3 MCV 73 platelets 620; ferritin 5 [labs through PCP]. S/p IV iron- Improved- HB-13; iron studes-pending today.  #The etiology of iron deficiency is unclear-will refer back to GI [place]; does not want any referrals to GI.    # DISPOSITION:  # no infusion today.  # follow up in 6 months/labs-cbcbmp/iron studies/ferrtin;Posisble iV iron-Dr.B.  Addendum: Patient's ferritin is 8; saturation is 12%.  Recommend Venofer next week. Cc: Dr.Aycock-.

## 2018-10-29 NOTE — Telephone Encounter (Signed)
Spoke with patient. apts provided for this Wednesday at 1:30 pm.

## 2018-10-29 NOTE — Telephone Encounter (Signed)
Please inform patient that her iron numbers are low-would recommend IV iron next week.  Please schedule

## 2018-10-31 ENCOUNTER — Inpatient Hospital Stay: Payer: Self-pay

## 2018-10-31 VITALS — BP 120/79 | HR 73 | Temp 97.4°F | Resp 18

## 2018-10-31 DIAGNOSIS — D500 Iron deficiency anemia secondary to blood loss (chronic): Secondary | ICD-10-CM

## 2018-10-31 MED ORDER — SODIUM CHLORIDE 0.9 % IV SOLN
Freq: Once | INTRAVENOUS | Status: AC
  Administered 2018-10-31: 14:00:00 via INTRAVENOUS
  Filled 2018-10-31: qty 250

## 2018-10-31 MED ORDER — IRON SUCROSE 20 MG/ML IV SOLN
200.0000 mg | Freq: Once | INTRAVENOUS | Status: AC
  Administered 2018-10-31: 200 mg via INTRAVENOUS
  Filled 2018-10-31: qty 10

## 2019-04-29 ENCOUNTER — Inpatient Hospital Stay: Payer: Self-pay

## 2019-04-29 ENCOUNTER — Inpatient Hospital Stay: Payer: Self-pay | Admitting: Internal Medicine

## 2019-05-13 ENCOUNTER — Other Ambulatory Visit: Payer: Self-pay

## 2019-05-13 ENCOUNTER — Ambulatory Visit: Payer: Self-pay | Admitting: Internal Medicine

## 2019-05-13 ENCOUNTER — Ambulatory Visit: Payer: Self-pay

## 2019-05-30 ENCOUNTER — Other Ambulatory Visit: Payer: Self-pay

## 2019-05-30 ENCOUNTER — Encounter: Payer: Self-pay | Admitting: Internal Medicine

## 2019-05-30 ENCOUNTER — Inpatient Hospital Stay (HOSPITAL_BASED_OUTPATIENT_CLINIC_OR_DEPARTMENT_OTHER): Payer: Self-pay | Admitting: Internal Medicine

## 2019-05-30 ENCOUNTER — Inpatient Hospital Stay: Payer: Self-pay

## 2019-05-30 ENCOUNTER — Inpatient Hospital Stay: Payer: Self-pay | Attending: Internal Medicine

## 2019-05-30 VITALS — BP 137/85 | HR 60 | Temp 97.5°F | Resp 18

## 2019-05-30 DIAGNOSIS — R945 Abnormal results of liver function studies: Secondary | ICD-10-CM

## 2019-05-30 DIAGNOSIS — D500 Iron deficiency anemia secondary to blood loss (chronic): Secondary | ICD-10-CM

## 2019-05-30 DIAGNOSIS — R7989 Other specified abnormal findings of blood chemistry: Secondary | ICD-10-CM

## 2019-05-30 DIAGNOSIS — N920 Excessive and frequent menstruation with regular cycle: Secondary | ICD-10-CM | POA: Insufficient documentation

## 2019-05-30 DIAGNOSIS — R160 Hepatomegaly, not elsewhere classified: Secondary | ICD-10-CM

## 2019-05-30 LAB — COMPREHENSIVE METABOLIC PANEL
ALT: 70 U/L — ABNORMAL HIGH (ref 0–44)
AST: 20 U/L (ref 15–41)
Albumin: 4.1 g/dL (ref 3.5–5.0)
Alkaline Phosphatase: 182 U/L — ABNORMAL HIGH (ref 38–126)
Anion gap: 10 (ref 5–15)
BUN: 18 mg/dL (ref 6–20)
CO2: 23 mmol/L (ref 22–32)
Calcium: 8.9 mg/dL (ref 8.9–10.3)
Chloride: 105 mmol/L (ref 98–111)
Creatinine, Ser: 1.01 mg/dL — ABNORMAL HIGH (ref 0.44–1.00)
GFR calc Af Amer: >60 mL/min (ref >60)
GFR calc non Af Amer: >60 mL/min (ref >60)
Glucose, Bld: 87 mg/dL (ref 70–99)
Potassium: 3.4 mmol/L — ABNORMAL LOW (ref 3.5–5.1)
Sodium: 138 mmol/L (ref 135–145)
Total Bilirubin: 1.5 mg/dL — ABNORMAL HIGH (ref 0.3–1.2)
Total Protein: 8.6 g/dL — ABNORMAL HIGH (ref 6.5–8.1)

## 2019-05-30 LAB — IRON AND TIBC
Iron: 94 ug/dL (ref 28–170)
Saturation Ratios: 26 % (ref 10.4–31.8)
TIBC: 366 ug/dL (ref 250–450)
UIBC: 272 ug/dL

## 2019-05-30 LAB — CBC WITH DIFFERENTIAL/PLATELET
Abs Immature Granulocytes: 0.03 10*3/uL (ref 0.00–0.07)
Basophils Absolute: 0.1 10*3/uL (ref 0.0–0.1)
Basophils Relative: 1 %
Eosinophils Absolute: 0.2 10*3/uL (ref 0.0–0.5)
Eosinophils Relative: 2 %
HCT: 40 % (ref 36.0–46.0)
Hemoglobin: 12.9 g/dL (ref 12.0–15.0)
Immature Granulocytes: 0 %
Lymphocytes Relative: 21 %
Lymphs Abs: 2 10*3/uL (ref 0.7–4.0)
MCH: 29.5 pg (ref 26.0–34.0)
MCHC: 32.3 g/dL (ref 30.0–36.0)
MCV: 91.5 fL (ref 80.0–100.0)
Monocytes Absolute: 0.5 10*3/uL (ref 0.1–1.0)
Monocytes Relative: 5 %
Neutro Abs: 6.7 10*3/uL (ref 1.7–7.7)
Neutrophils Relative %: 71 %
Platelets: 421 10*3/uL — ABNORMAL HIGH (ref 150–400)
RBC: 4.37 MIL/uL (ref 3.87–5.11)
RDW: 13.4 % (ref 11.5–15.5)
WBC: 9.5 10*3/uL (ref 4.0–10.5)
nRBC: 0 % (ref 0.0–0.2)

## 2019-05-30 LAB — FERRITIN: Ferritin: 13 ng/mL (ref 11–307)

## 2019-05-30 MED ORDER — IRON SUCROSE 20 MG/ML IV SOLN
200.0000 mg | Freq: Once | INTRAVENOUS | Status: AC
  Administered 2019-05-30: 200 mg via INTRAVENOUS
  Filled 2019-05-30: qty 10

## 2019-05-30 MED ORDER — SODIUM CHLORIDE 0.9 % IV SOLN
Freq: Once | INTRAVENOUS | Status: AC
  Administered 2019-05-30: 12:00:00 via INTRAVENOUS
  Filled 2019-05-30: qty 250

## 2019-05-30 NOTE — Assessment & Plan Note (Addendum)
#  Iron deficiency anemia-unclear etiology; hemoglobin 8.3 MCV 73 platelets 620; ferritin 5 [labs through PCP]. S/p IV iron- Improved- HB-13; iron studes-pending today.  Proceed with IV iron today.  #Mildly elevated LFTs/anemia unclear etiology-recommend CT scan abdomen pelvis.  Also recommend GI evaluation-patient has previously declined because of monetary/insurance reasons.  Will call with results.  # DISPOSITION:  # IV venofer today. # CT scan A/P asap # referral to [facility] re: anemia/ elevated LFts # follow up in 3 months/labs-cbcbmp/Posisble IV Venofer-Dr.B.  Cc: Dr.Aycock-.

## 2019-05-30 NOTE — Progress Notes (Signed)
[facility] CONSULT NOTE  Patient Care Team: Patient, No Pcp Per as PCP - General (General Practice)  CHIEF COMPLAINTS/PURPOSE OF CONSULTATION: Iron deficiency anemia  #Hx of IDA [>20 years] Iron deficiency Anemia: [hb8.3/ ferritin- 5 /PCP] s/p IV venofer; Declined GI eval.    #    Oncology History   No history exists.     HISTORY OF PRESENTING ILLNESS:  Jasmin Molina 53 y.o.  female with a history of iron deficient anemia is here for follow-up.    Patient notes to improvement of energy levels after received IV Venofer 6 months ago.  At this time she continues to have progressive shortness of breath/especially on exertion.  Continued fatigue.  Denies any heavy menstrual periods.  Denies any alcohol.  Denies any significant Tylenol use.  Review of Systems  Constitutional: Positive for malaise/fatigue. Negative for chills, diaphoresis, fever and weight loss.  HENT: Negative for nosebleeds and sore throat.   Eyes: Negative for double vision.  Respiratory: Negative for cough, hemoptysis, sputum production, shortness of breath and wheezing.   Cardiovascular: Negative for chest pain, palpitations, orthopnea and leg swelling.  Gastrointestinal: Negative for abdominal pain, blood in stool, constipation, diarrhea, heartburn, melena, nausea and vomiting.  Genitourinary: Negative for dysuria, frequency and urgency.  Musculoskeletal: Negative for back pain and joint pain.  Skin: Negative.  Negative for itching and rash.  Neurological: Negative for dizziness, tingling, focal weakness, weakness and headaches.  Endo/Heme/Allergies: Does not bruise/bleed easily.  Psychiatric/Behavioral: Negative for depression. The patient is not nervous/anxious and does not have insomnia.     MEDICAL HISTORY:  History reviewed. No pertinent past medical history.  SURGICAL HISTORY: History reviewed. No pertinent surgical history.  SOCIAL HISTORY:  Social History   Socioeconomic  History  . Marital status: Single    Spouse name: Not on file  . Number of children: Not on file  . Years of education: Not on file  . Highest education level: Not on file  Occupational History  . Not on file  Social Needs  . Financial resource strain: Not hard at all  . Food insecurity    Worry: Never true    Inability: Never true  . Transportation needs    Medical: No    Non-medical: No  Tobacco Use  . Smoking status: Never Smoker  . Smokeless tobacco: Never Used  Substance and Sexual Activity  . Alcohol use: No    Frequency: Never  . Drug use: No  . Sexual activity: Not on file  Lifestyle  . Physical activity    Days per week: Not on file    Minutes per session: Not on file  . Stress: Rather much  Relationships  . Social Herbalist on phone: Not on file    Gets together: Not on file    Attends religious service: Not on file    Active member of club or organization: Not on file    Attends meetings of clubs or organizations: Not on file    Relationship status: Not on file  . Intimate partner violence    Fear of current or ex partner: Not on file    Emotionally abused: Not on file    Physically abused: Not on file    Forced sexual activity: Not on file  Other Topics Concern  . 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     FAMILY HISTORY:  Family History  Problem Relation Age of Onset  . Cancer Maternal Uncle        Prostate cancer  . Cancer Paternal Uncle        Prostate cancer    ALLERGIES:  is allergic to codeine.  MEDICATIONS:  Current Outpatient Medications  Medication Sig Dispense Refill  . cyanocobalamin (,VITAMIN B-12,) 1000 MCG/ML injection Inject 1,000 mcg into the muscle once.    Marland Kitchen. ibuprofen (ADVIL,MOTRIN) 800 MG tablet Take 800 mg by mouth as needed.    Marland Kitchen. levothyroxine (SYNTHROID, LEVOTHROID) 150 MCG tablet Take 150 mcg by mouth daily before breakfast.     No current  facility-administered medications for this visit.       Marland Kitchen.  PHYSICAL EXAMINATION: ECOG PERFORMANCE STATUS: 0 - Asymptomatic  Vitals:   05/30/19 1048  BP: 130/86  Pulse: 70  Resp: 20  Temp: (!) 97.5 F (36.4 C)   Filed Weights   05/30/19 1048  Weight: 186 lb (84.4 kg)    Physical Exam  Constitutional: She is oriented to person, place, and time and well-developed, well-nourished, and in no distress.  HENT:  Head: Normocephalic and atraumatic.  Mouth/Throat: Oropharynx is clear and moist. No oropharyngeal exudate.  Eyes: Pupils are equal, round, and reactive to light.  Neck: Normal range of motion. Neck supple.  Cardiovascular: Normal rate and regular rhythm.  Pulmonary/Chest: No respiratory distress. She has no wheezes.  Abdominal: Soft. Bowel sounds are normal. She exhibits no distension and no mass. There is no abdominal tenderness. There is no rebound and no guarding.  Musculoskeletal: Normal range of motion.        General: No tenderness or edema.  Neurological: She is alert and oriented to person, place, and time.  Skin: Skin is warm.  Psychiatric: Affect normal.     LABORATORY DATA:  I have reviewed the data as listed Lab Results  Component Value Date   WBC 9.5 05/30/2019   HGB 12.9 05/30/2019   HCT 40.0 05/30/2019   MCV 91.5 05/30/2019   PLT 421 (H) 05/30/2019   Recent Labs    10/29/18 1240 05/30/19 1024  NA 138 138  K 4.3 3.4*  CL 104 105  CO2 25 23  GLUCOSE 83 87  BUN 20 18  CREATININE 1.01* 1.01*  CALCIUM 9.0 8.9  GFRNONAA >60 >60  GFRAA >60 >60  PROT 8.4* 8.6*  ALBUMIN 4.3 4.1  AST 19 20  ALT 23 70*  ALKPHOS 96 182*  BILITOT 1.2 1.5*    RADIOGRAPHIC STUDIES: I have personally reviewed the radiological images as listed and agreed with the findings in the report. No results found.  ASSESSMENT & PLAN:   Iron deficiency anemia due to chronic blood loss #Iron deficiency anemia-unclear etiology; hemoglobin 8.3 MCV 73 platelets 620;  ferritin 5 [labs through PCP]. S/p IV iron- Improved- HB-13; iron studes-pending today.  Proceed with IV iron today.  #Mildly elevated LFTs/anemia unclear etiology-recommend CT scan abdomen pelvis.  Also recommend GI evaluation-patient has previously declined because of monetary/insurance reasons.  Will call with results.  # DISPOSITION:  # IV venofer today. # CT scan A/P asap # referral to [facility] re: anemia/ elevated LFts # follow up in 3 months/labs-cbcbmp/Posisble IV Venofer-Dr.B.  Cc: Dr.Aycock-.   All questions were answered. The patient knows to call the clinic with any problems, questions or concerns.    Earna CoderGovinda R Brahmanday, MD 05/30/2019 1:59 PM

## 2019-06-03 ENCOUNTER — Ambulatory Visit: Payer: Self-pay | Admitting: Internal Medicine

## 2019-06-03 ENCOUNTER — Other Ambulatory Visit: Payer: Self-pay

## 2019-06-03 ENCOUNTER — Ambulatory Visit: Payer: Self-pay

## 2019-06-04 ENCOUNTER — Encounter: Payer: Self-pay | Admitting: Pharmacy Technician

## 2019-06-04 NOTE — Progress Notes (Signed)
Patient has been approved for drug assistance by [employer] for Venofer. The re-enrollment period is from 06/03/19-06/02/20 based on self pay. First DOS covered is 06/03/19.

## 2019-06-06 ENCOUNTER — Ambulatory Visit
Admission: RE | Admit: 2019-06-06 | Discharge: 2019-06-06 | Disposition: A | Discharge status: Home / Self Care | Payer: Self-pay | Source: Ambulatory Visit | Attending: Internal Medicine | Admitting: Internal Medicine

## 2019-06-06 ENCOUNTER — Other Ambulatory Visit: Payer: Self-pay

## 2019-06-06 DIAGNOSIS — R7989 Other specified abnormal findings of blood chemistry: Secondary | ICD-10-CM

## 2019-06-06 DIAGNOSIS — D500 Iron deficiency anemia secondary to blood loss (chronic): Secondary | ICD-10-CM | POA: Insufficient documentation

## 2019-06-06 DIAGNOSIS — R160 Hepatomegaly, not elsewhere classified: Secondary | ICD-10-CM | POA: Insufficient documentation

## 2019-06-06 DIAGNOSIS — R945 Abnormal results of liver function studies: Secondary | ICD-10-CM | POA: Insufficient documentation

## 2019-06-06 MED ORDER — IOHEXOL 300 MG/ML  SOLN
100.0000 mL | Freq: Once | INTRAMUSCULAR | Status: AC | PRN
  Administered 2019-06-06: 100 mL via INTRAVENOUS

## 2019-06-08 ENCOUNTER — Telehealth: Payer: Self-pay | Admitting: Internal Medicine

## 2019-06-08 NOTE — Telephone Encounter (Signed)
Spoke to patient regarding results of the CT scan abdomen pelvis-possible uterine fibroid/endometrial thickening-recommend further work-up with gynecology/pelvic ultrasound.  Patient does not have a gynecologist/follows up with PCP.  As per patient PCP attempted biopsy 1 to 2 years ago-unsuccessful.  Lauren-I would appreciate if you can give your thoughts on how to proceed further. Pt prefers female provider.   Thx GB

## 2019-06-10 ENCOUNTER — Telehealth: Payer: Self-pay | Admitting: Nurse Practitioner

## 2019-06-10 NOTE — Telephone Encounter (Signed)
Please see my phone note

## 2019-06-10 NOTE — Telephone Encounter (Signed)
Patient had endometrial biopsy 12/28/2017 with Dr. Clide Deutscher at [facility] which did not show endometrial tissue. Per patient, 'she made two passes and didn't get much'. Pap at that time was NILM and HPV negative. Patient says she has irregular, heavy menstrual periods and was having her period when scan was done. She is self-pay/no insurance and is concerned of cost of repeating biopsy at another clinic and wishes to be seen at [facility]. She says that she has not been a year with out menses and had her hormone levels checked last year at [facility] and was told she was not post-menopausal. Discussed repeating endometrial biopsy at [facility] and that if insufficient tissue to would refer her to a local gynecologist vs gyn-onc for further evaluation and management. She prefers female provider. I faxed report from recent CT scan to [facility] as well.

## 2019-07-10 ENCOUNTER — Encounter: Payer: Self-pay | Admitting: Gastroenterology

## 2019-07-10 ENCOUNTER — Ambulatory Visit (INDEPENDENT_AMBULATORY_CARE_PROVIDER_SITE_OTHER): Payer: Self-pay | Admitting: Gastroenterology

## 2019-07-10 DIAGNOSIS — Z5329 Procedure and treatment not carried out because of patient's decision for other reasons: Secondary | ICD-10-CM

## 2019-07-10 NOTE — Progress Notes (Signed)
No show

## 2019-08-29 ENCOUNTER — Encounter: Payer: Self-pay | Admitting: Internal Medicine

## 2019-08-29 ENCOUNTER — Other Ambulatory Visit: Payer: Self-pay

## 2019-08-30 ENCOUNTER — Inpatient Hospital Stay: Payer: Self-pay

## 2019-08-30 ENCOUNTER — Other Ambulatory Visit: Payer: Self-pay

## 2019-08-30 ENCOUNTER — Inpatient Hospital Stay (HOSPITAL_BASED_OUTPATIENT_CLINIC_OR_DEPARTMENT_OTHER): Payer: Self-pay | Admitting: Internal Medicine

## 2019-08-30 ENCOUNTER — Inpatient Hospital Stay: Payer: Self-pay | Attending: Internal Medicine

## 2019-08-30 DIAGNOSIS — D500 Iron deficiency anemia secondary to blood loss (chronic): Secondary | ICD-10-CM

## 2019-08-30 DIAGNOSIS — R7989 Other specified abnormal findings of blood chemistry: Secondary | ICD-10-CM

## 2019-08-30 DIAGNOSIS — R160 Hepatomegaly, not elsewhere classified: Secondary | ICD-10-CM

## 2019-08-30 DIAGNOSIS — D509 Iron deficiency anemia, unspecified: Secondary | ICD-10-CM | POA: Insufficient documentation

## 2019-08-30 LAB — CBC WITH DIFFERENTIAL/PLATELET
Abs Immature Granulocytes: 0.01 10*3/uL (ref 0.00–0.07)
Basophils Absolute: 0.1 10*3/uL (ref 0.0–0.1)
Basophils Relative: 1 %
Eosinophils Absolute: 0.2 10*3/uL (ref 0.0–0.5)
Eosinophils Relative: 4 %
HCT: 33.3 % — ABNORMAL LOW (ref 36.0–46.0)
Hemoglobin: 10.3 g/dL — ABNORMAL LOW (ref 12.0–15.0)
Immature Granulocytes: 0 %
Lymphocytes Relative: 24 %
Lymphs Abs: 1.5 10*3/uL (ref 0.7–4.0)
MCH: 26.1 pg (ref 26.0–34.0)
MCHC: 30.9 g/dL (ref 30.0–36.0)
MCV: 84.5 fL (ref 80.0–100.0)
Monocytes Absolute: 0.5 10*3/uL (ref 0.1–1.0)
Monocytes Relative: 7 %
Neutro Abs: 4.1 10*3/uL (ref 1.7–7.7)
Neutrophils Relative %: 64 %
Platelets: 502 10*3/uL — ABNORMAL HIGH (ref 150–400)
RBC: 3.94 MIL/uL (ref 3.87–5.11)
RDW: 14.4 % (ref 11.5–15.5)
WBC: 6.3 10*3/uL (ref 4.0–10.5)
nRBC: 0 % (ref 0.0–0.2)

## 2019-08-30 LAB — BASIC METABOLIC PANEL
Anion gap: 5 (ref 5–15)
BUN: 15 mg/dL (ref 6–20)
CO2: 26 mmol/L (ref 22–32)
Calcium: 9 mg/dL (ref 8.9–10.3)
Chloride: 106 mmol/L (ref 98–111)
Creatinine, Ser: 1.03 mg/dL — ABNORMAL HIGH (ref 0.44–1.00)
GFR calc Af Amer: >60 mL/min (ref >60)
GFR calc non Af Amer: >60 mL/min (ref >60)
Glucose, Bld: 90 mg/dL (ref 70–99)
Potassium: 4.2 mmol/L (ref 3.5–5.1)
Sodium: 137 mmol/L (ref 135–145)

## 2019-08-30 MED ORDER — IRON SUCROSE 20 MG/ML IV SOLN
200.0000 mg | Freq: Once | INTRAVENOUS | Status: AC
  Administered 2019-08-30: 200 mg via INTRAVENOUS
  Filled 2019-08-30: qty 10

## 2019-08-30 MED ORDER — SODIUM CHLORIDE 0.9 % IV SOLN
Freq: Once | INTRAVENOUS | Status: AC
  Administered 2019-08-30: 11:00:00 via INTRAVENOUS
  Filled 2019-08-30: qty 250

## 2019-08-30 NOTE — Progress Notes (Signed)
[facility] CONSULT NOTE  Patient Care Team: Donnie Coffin, MD as PCP - General (Family Medicine)  CHIEF COMPLAINTS/PURPOSE OF CONSULTATION: Iron deficiency anemia  #Hx of IDA [>20 years] Iron deficiency Anemia: [hb8.3/ ferritin- 5 /PCP] s/p IV venofer; improved; CT scan August 2020-fibroid  #August 2020 CT scan-fibroid [previous biopsy with PCP; unsuccessful]; SEP 2020- GI- no show [no EGD/colo]   Oncology History   No history exists.     HISTORY OF PRESENTING ILLNESS:  Jasmin Molina 53 y.o.  female with a history of iron deficient anemia is here for follow-up.    Patient unfortunately unable to keep her appointment with PCP regarding uterine fibroid/and also unable to keep appointment with GI-as her fianc recently died in July 18, 2019.  Patient admits to worsening fatigue over the last few weeks.  Poor tolerance to iron.  Patient denies any blood in stools black or stools or any blood in urine.  Review of Systems  Constitutional: Positive for malaise/fatigue. Negative for chills, diaphoresis, fever and weight loss.  HENT: Negative for nosebleeds and sore throat.   Eyes: Negative for double vision.  Respiratory: Negative for cough, hemoptysis, sputum production, shortness of breath and wheezing.   Cardiovascular: Negative for chest pain, palpitations, orthopnea and leg swelling.  Gastrointestinal: Negative for abdominal pain, blood in stool, constipation, diarrhea, heartburn, melena, nausea and vomiting.  Genitourinary: Negative for dysuria, frequency and urgency.  Musculoskeletal: Negative for back pain and joint pain.  Skin: Negative.  Negative for itching and rash.  Neurological: Negative for dizziness, tingling, focal weakness, weakness and headaches.  Endo/Heme/Allergies: Does not bruise/bleed easily.  Psychiatric/Behavioral: Negative for depression. The patient is not nervous/anxious and does not have insomnia.     MEDICAL HISTORY:  History  reviewed. No pertinent past medical history.  SURGICAL HISTORY: History reviewed. No pertinent surgical history.  SOCIAL HISTORY:  Social History   Socioeconomic History  . Marital status: Single    Spouse name: Not on file  . Number of children: Not on file  . Years of education: Not on file  . Highest education level: Not on file  Occupational History  . Not on file  Social Needs  . Financial resource strain: Not hard at all  . Food insecurity    Worry: Never true    Inability: Never true  . Transportation needs    Medical: No    Non-medical: No  Tobacco Use  . Smoking status: Never Smoker  . Smokeless tobacco: Never Used  Substance and Sexual Activity  . Alcohol use: No    Frequency: Never  . Drug use: No  . Sexual activity: Not on file  Lifestyle  . Physical activity    Days per week: Not on file    Minutes per session: Not on file  . Stress: Rather much  Relationships  . Social Herbalist on phone: Not on file    Gets together: Not on file    Attends religious service: Not on file    Active member of club or organization: Not on file    Attends meetings of clubs or organizations: Not on file    Relationship status: Not on file  . Intimate partner violence    Fear of current or ex partner: Not on file    Emotionally abused: Not on file    Physically abused: Not on file    Forced sexual activity: Not on file  Other Topics Concern  . 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     FAMILY HISTORY:  Family History  Problem Relation Age of Onset  . Cancer Maternal Uncle        Prostate cancer  . Cancer Paternal Uncle        Prostate cancer    ALLERGIES:  is allergic to codeine.  MEDICATIONS:  Current Outpatient Medications  Medication Sig Dispense Refill  . cyanocobalamin (,VITAMIN B-12,) 1000 MCG/ML injection Inject 1,000 mcg into the muscle once.    Marland Kitchen ibuprofen (ADVIL,MOTRIN)  800 MG tablet Take 800 mg by mouth as needed.    Marland Kitchen levothyroxine (SYNTHROID, LEVOTHROID) 150 MCG tablet Take 150 mcg by mouth daily before breakfast.     No current facility-administered medications for this visit.       Marland Kitchen  PHYSICAL EXAMINATION: ECOG PERFORMANCE STATUS: 0 - Asymptomatic  Vitals:   08/30/19 1039  BP: 119/74  Pulse: (!) 50  Resp: 20  Temp: 98.2 F (36.8 C)   Filed Weights   08/30/19 1039  Weight: 171 lb (77.6 kg)    Physical Exam  Constitutional: She is oriented to person, place, and time and well-developed, well-nourished, and in no distress.  HENT:  Head: Normocephalic and atraumatic.  Mouth/Throat: Oropharynx is clear and moist. No oropharyngeal exudate.  Eyes: Pupils are equal, round, and reactive to light.  Neck: Normal range of motion. Neck supple.  Cardiovascular: Normal rate and regular rhythm.  Pulmonary/Chest: No respiratory distress. She has no wheezes.  Abdominal: Soft. Bowel sounds are normal. She exhibits no distension and no mass. There is no abdominal tenderness. There is no rebound and no guarding.  Musculoskeletal: Normal range of motion.        General: No tenderness or edema.  Neurological: She is alert and oriented to person, place, and time.  Skin: Skin is warm.  Psychiatric: Affect normal.     LABORATORY DATA:  I have reviewed the data as listed Lab Results  Component Value Date   WBC 6.3 08/30/2019   HGB 10.3 (L) 08/30/2019   HCT 33.3 (L) 08/30/2019   MCV 84.5 08/30/2019   PLT 502 (H) 08/30/2019   Recent Labs    10/29/18 1240 05/30/19 1024 08/30/19 1030  NA 138 138 137  K 4.3 3.4* 4.2  CL 104 105 106  CO2 25 23 26   GLUCOSE 83 87 90  BUN 20 18 15   CREATININE 1.01* 1.01* 1.03*  CALCIUM 9.0 8.9 9.0  GFRNONAA >60 >60 >60  GFRAA >60 >60 >60  PROT 8.4* 8.6*  --   ALBUMIN 4.3 4.1  --   AST 19 20  --   ALT 23 70*  --   ALKPHOS 96 182*  --   BILITOT 1.2 1.5*  --     RADIOGRAPHIC STUDIES: I have personally  reviewed the radiological images as listed and agreed with the findings in the report. No results found.  ASSESSMENT & PLAN:   Iron deficiency anemia due to chronic blood loss #Iron deficiency anemia-unclear etiology; hemoglobin 8.3 MCV 73 platelets 620; ferritin 5 [labs through PCP]. S/p IV iron- Improved- HB-13; however, today hemoglobin is 10.3.  Proceed with IV iron today.  Recommend reevaluation with GI.  #Mildly elevated LFTs/anemia unclear etiology CT AUG 2020- NED.   # Uterine fibroid- no vaginal bleeding since AUG 2020.  Recommend follow-up with PCP regarding further evaluation repeat biopsy.   # DISPOSITION:  # IV venofer today. # follow up  in 3 months/labs-cbcbmp/iron studies//ferritin-Posisble IV Venofer-Dr.B.  Cc: Dr.Aycock-.   All questions were answered. The patient knows to call the clinic with any problems, questions or concerns.    Earna CoderGovinda Molina , MD 08/30/2019 12:08 PM

## 2019-08-30 NOTE — Assessment & Plan Note (Addendum)
#  Iron deficiency anemia-unclear etiology; hemoglobin 8.3 MCV 73 platelets 620; ferritin 5 [labs through PCP]. S/p IV iron- Improved- HB-13; however, today hemoglobin is 10.3.  Proceed with IV iron today.  Recommend reevaluation with GI.  #Mildly elevated LFTs/anemia unclear etiology CT AUG 2020- NED.   # Uterine fibroid- no vaginal bleeding since AUG 2020.  Recommend follow-up with PCP regarding further evaluation repeat biopsy.   # DISPOSITION:  # IV venofer today. # follow up in 3 months/labs-cbcbmp/iron studies//ferritin-Posisble IV Venofer-Dr.B.  Cc: Dr.Aycock-.

## 2019-11-29 ENCOUNTER — Ambulatory Visit: Payer: Self-pay | Admitting: Internal Medicine

## 2019-11-29 ENCOUNTER — Ambulatory Visit: Payer: Self-pay

## 2019-11-29 ENCOUNTER — Other Ambulatory Visit: Payer: Self-pay

## 2020-03-04 ENCOUNTER — Telehealth: Payer: Self-pay | Admitting: Internal Medicine

## 2020-03-04 NOTE — Telephone Encounter (Signed)
I will send the patient a mychart msg to contact the Financial Assistance Department at [facility]. [phone]

## 2020-03-04 NOTE — Telephone Encounter (Signed)
Pt is requesting assistance with getting her bill paid? shs stated she has already qualified for assistance with the iron infusion, but she needs help covering the providers part? I'm not sure whom I should forward that information too. Pt would like some guidance.

## 2020-03-25 ENCOUNTER — Inpatient Hospital Stay: Payer: Self-pay | Attending: Internal Medicine | Admitting: Internal Medicine

## 2020-03-25 ENCOUNTER — Inpatient Hospital Stay: Payer: Self-pay

## 2020-03-25 ENCOUNTER — Encounter: Payer: Self-pay | Admitting: Internal Medicine

## 2020-03-25 ENCOUNTER — Other Ambulatory Visit: Payer: Self-pay | Admitting: Licensed Clinical Social Worker

## 2020-03-25 ENCOUNTER — Other Ambulatory Visit: Payer: Self-pay

## 2020-03-25 VITALS — BP 119/66 | HR 61 | Resp 18

## 2020-03-25 DIAGNOSIS — D500 Iron deficiency anemia secondary to blood loss (chronic): Secondary | ICD-10-CM

## 2020-03-25 LAB — COMPREHENSIVE METABOLIC PANEL
ALT: 11 U/L (ref 0–44)
AST: 10 U/L — ABNORMAL LOW (ref 15–41)
Albumin: 3.9 g/dL (ref 3.5–5.0)
Alkaline Phosphatase: 64 U/L (ref 38–126)
Anion gap: 9 (ref 5–15)
BUN: 14 mg/dL (ref 6–20)
CO2: 26 mmol/L (ref 22–32)
Calcium: 8.7 mg/dL — ABNORMAL LOW (ref 8.9–10.3)
Chloride: 103 mmol/L (ref 98–111)
Creatinine, Ser: 1 mg/dL (ref 0.44–1.00)
GFR calc Af Amer: >60 mL/min (ref >60)
GFR calc non Af Amer: >60 mL/min (ref >60)
Glucose, Bld: 93 mg/dL (ref 70–99)
Potassium: 4.7 mmol/L (ref 3.5–5.1)
Sodium: 138 mmol/L (ref 135–145)
Total Bilirubin: 1.3 mg/dL — ABNORMAL HIGH (ref 0.3–1.2)
Total Protein: 7.8 g/dL (ref 6.5–8.1)

## 2020-03-25 LAB — IRON AND TIBC
Iron: 62 ug/dL (ref 28–170)
Saturation Ratios: 16 % (ref 10.4–31.8)
TIBC: 395 ug/dL (ref 250–450)
UIBC: 333 ug/dL

## 2020-03-25 LAB — CBC WITH DIFFERENTIAL/PLATELET
Abs Immature Granulocytes: 0.02 10*3/uL (ref 0.00–0.07)
Basophils Absolute: 0.1 10*3/uL (ref 0.0–0.1)
Basophils Relative: 1 %
Eosinophils Absolute: 0.2 10*3/uL (ref 0.0–0.5)
Eosinophils Relative: 3 %
HCT: 33.1 % — ABNORMAL LOW (ref 36.0–46.0)
Hemoglobin: 10.8 g/dL — ABNORMAL LOW (ref 12.0–15.0)
Immature Granulocytes: 0 %
Lymphocytes Relative: 23 %
Lymphs Abs: 1.5 10*3/uL (ref 0.7–4.0)
MCH: 28.3 pg (ref 26.0–34.0)
MCHC: 32.6 g/dL (ref 30.0–36.0)
MCV: 86.9 fL (ref 80.0–100.0)
Monocytes Absolute: 0.5 10*3/uL (ref 0.1–1.0)
Monocytes Relative: 7 %
Neutro Abs: 4.1 10*3/uL (ref 1.7–7.7)
Neutrophils Relative %: 66 %
Platelets: 477 10*3/uL — ABNORMAL HIGH (ref 150–400)
RBC: 3.81 MIL/uL — ABNORMAL LOW (ref 3.87–5.11)
RDW: 13.6 % (ref 11.5–15.5)
WBC: 6.4 10*3/uL (ref 4.0–10.5)
nRBC: 0 % (ref 0.0–0.2)

## 2020-03-25 LAB — FERRITIN: Ferritin: 5 ng/mL — ABNORMAL LOW (ref 11–307)

## 2020-03-25 MED ORDER — IRON SUCROSE 20 MG/ML IV SOLN
200.0000 mg | Freq: Once | INTRAVENOUS | Status: AC
  Administered 2020-03-25: 200 mg via INTRAVENOUS
  Filled 2020-03-25: qty 10

## 2020-03-25 MED ORDER — SODIUM CHLORIDE 0.9 % IV SOLN
Freq: Once | INTRAVENOUS | Status: AC
  Filled 2020-03-25: qty 250

## 2020-03-25 NOTE — Progress Notes (Signed)
[facility] CONSULT NOTE  Patient Care Team: Donnie Coffin, MD as PCP - General (Family Medicine)  CHIEF COMPLAINTS/PURPOSE OF CONSULTATION: Iron deficiency anemia  #Hx of IDA [>20 years] Iron deficiency Anemia: [hb8.3/ ferritin- 5 /PCP] s/p IV venofer; improved; CT scan August 2020-fibroid  #August 2020 CT scan-fibroid [previous biopsy with PCP; unsuccessful]; SEP 2020- GI- no show [no EGD/colo]   Oncology History   No history exists.     HISTORY OF PRESENTING ILLNESS:  Jasmin Molina 54 y.o.  female with a history of iron deficient anemia is here for follow-up.    Patient complains of worsening fatigue.  She complains of legs giving out.  No blood in stools or black or stools.  Patient states her menstrual cycles are not heavy at this time.  Review of Systems  Constitutional: Positive for malaise/fatigue. Negative for chills, diaphoresis, fever and weight loss.  HENT: Negative for nosebleeds and sore throat.   Eyes: Negative for double vision.  Respiratory: Negative for cough, hemoptysis, sputum production, shortness of breath and wheezing.   Cardiovascular: Negative for chest pain, palpitations, orthopnea and leg swelling.  Gastrointestinal: Negative for abdominal pain, blood in stool, constipation, diarrhea, heartburn, melena, nausea and vomiting.  Genitourinary: Negative for dysuria, frequency and urgency.  Musculoskeletal: Negative for back pain and joint pain.  Skin: Negative.  Negative for itching and rash.  Neurological: Negative for dizziness, tingling, focal weakness, weakness and headaches.  Endo/Heme/Allergies: Does not bruise/bleed easily.  Psychiatric/Behavioral: Negative for depression. The patient is not nervous/anxious and does not have insomnia.     MEDICAL HISTORY:  History reviewed. No pertinent past medical history.  SURGICAL HISTORY: History reviewed. No pertinent surgical history.  SOCIAL HISTORY:  Social History    Socioeconomic History  . Marital status: Single    Spouse name: Not on file  . Number of children: Not on file  . Years of education: Not on file  . Highest education level: Not on file  Occupational History  . Not on file  Tobacco Use  . Smoking status: Never Smoker  . Smokeless tobacco: Never Used  Substance and Sexual Activity  . Alcohol use: No  . Drug use: No  . Sexual activity: Not on file  Other Topics Concern  . 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    Social Determinants of Health   Financial Resource Strain:   . Difficulty of Paying Living Expenses:   Food Insecurity:   . Worried About Charity fundraiser in the Last Year:   . Arboriculturist in the Last Year:   Transportation Needs:   . Film/video editor (Medical):   Marland Kitchen Lack of Transportation (Non-Medical):   Physical Activity:   . Days of Exercise per Week:   . Minutes of Exercise per Session:   Stress:   . Feeling of Stress :   Social Connections:   . Frequency of Communication with Friends and Family:   . Frequency of Social Gatherings with Friends and Family:   . Attends Religious Services:   . Active Member of Clubs or Organizations:   . Attends Archivist Meetings:   Marland Kitchen Marital Status:   Intimate Partner Violence:   . Fear of Current or Ex-Partner:   . Emotionally Abused:   Marland Kitchen Physically Abused:   . Sexually Abused:     FAMILY HISTORY:  Family History  Problem Relation Age of  Onset  . Cancer Maternal Uncle        Prostate cancer  . Cancer Paternal Uncle        Prostate cancer    ALLERGIES:  is allergic to codeine.  MEDICATIONS:  Current Outpatient Medications  Medication Sig Dispense Refill  . cyanocobalamin (,VITAMIN B-12,) 1000 MCG/ML injection Inject 1,000 mcg into the muscle once.    Marland Kitchen ibuprofen (ADVIL,MOTRIN) 800 MG tablet Take 800 mg by mouth as needed.    Marland Kitchen levothyroxine (SYNTHROID, LEVOTHROID) 150  MCG tablet Take 125 mcg by mouth daily before breakfast.      No current facility-administered medications for this visit.      Marland Kitchen  PHYSICAL EXAMINATION: ECOG PERFORMANCE STATUS: 0 - Asymptomatic  Vitals:   03/25/20 1034  BP: 130/83  Pulse: (!) 50  Resp: 20  Temp: 98.2 F (36.8 C)   Filed Weights   03/25/20 1034  Weight: 177 lb (80.3 kg)    Physical Exam HENT:     Head: Normocephalic and atraumatic.     Mouth/Throat:     Pharynx: No oropharyngeal exudate.  Eyes:     Pupils: Pupils are equal, round, and reactive to light.  Cardiovascular:     Rate and Rhythm: Normal rate and regular rhythm.  Pulmonary:     Effort: No respiratory distress.     Breath sounds: No wheezing.  Abdominal:     General: Bowel sounds are normal. There is no distension.     Palpations: Abdomen is soft. There is no mass.     Tenderness: There is no abdominal tenderness. There is no guarding or rebound.  Musculoskeletal:        General: No tenderness. Normal range of motion.     Cervical back: Normal range of motion and neck supple.  Skin:    General: Skin is warm.  Neurological:     Mental Status: She is alert and oriented to person, place, and time.  Psychiatric:        Mood and Affect: Affect normal.      LABORATORY DATA:  I have reviewed the data as listed Lab Results  Component Value Date   WBC 6.4 03/25/2020   HGB 10.8 (L) 03/25/2020   HCT 33.1 (L) 03/25/2020   MCV 86.9 03/25/2020   PLT 477 (H) 03/25/2020   Recent Labs    05/30/19 1024 08/30/19 1030 03/25/20 1018  NA 138 137 138  K 3.4* 4.2 4.7  CL 105 106 103  CO2 23 26 26   GLUCOSE 87 90 93  BUN 18 15 14   CREATININE 1.01* 1.03* 1.00  CALCIUM 8.9 9.0 8.7*  GFRNONAA >60 >60 >60  GFRAA >60 >60 >60  PROT 8.6*  --  7.8  ALBUMIN 4.1  --  3.9  AST 20  --  10*  ALT 70*  --  11  ALKPHOS 182*  --  64  BILITOT 1.5*  --  1.3*    RADIOGRAPHIC STUDIES: I have personally reviewed the radiological images as listed and  agreed with the findings in the report. No results found.  ASSESSMENT & PLAN:   Iron deficiency anemia due to chronic blood loss #Iron deficiency anemia-unclear etiology; hemoglobin 8.3 MCV 73 platelets 620; ferritin 5 [labs through PCP]. S/p IV iron- Improved- HB-13; however, today hemoglobin is 10.3.  Proceed with IV iron today.  Recommend reevaluation with GI.  Discussed with Dr. ; kindly agrees to evaluate the patient.  #Mildly elevated LFTs/anemia unclear etiology CT AUG 2020- NED.  Stable.  # Uterine fibroid- no vaginal bleeding since AUG 2020.  Stable.  # DISPOSITION:  # referral to GI re: anemia- iron def # IV venofer today. # Venofer  In 1 week # follow up in 3 months/labs-cbc/Posisble IV Venofer-Dr.B.  Cc: Dr.Aycock-.   All questions were answered. The patient knows to call the clinic with any problems, questions or concerns.    Earna Coder, MD 03/25/2020 11:56 AM

## 2020-03-25 NOTE — Assessment & Plan Note (Addendum)
#  Iron deficiency anemia-unclear etiology; hemoglobin 8.3 MCV 73 platelets 620; ferritin 5 [labs through PCP]. S/p IV iron- Improved- HB-13; however, today hemoglobin is 10.3.  Proceed with IV iron today.  Recommend reevaluation with GI.  Discussed with Dr. Tobi Bastos; kindly agrees to evaluate the patient.  #Mildly elevated LFTs/anemia unclear etiology CT AUG 2020- NED.  Stable.  # Uterine fibroid- no vaginal bleeding since AUG 2020.  Stable.  # DISPOSITION:  # referral to GI re: anemia- iron def # IV venofer today. # Venofer  In 1 week # follow up in 3 months/labs-cbc/Posisble IV Venofer-Dr.B.  Cc: Dr.Aycock-.

## 2020-04-02 ENCOUNTER — Inpatient Hospital Stay: Payer: Self-pay

## 2020-04-02 ENCOUNTER — Other Ambulatory Visit: Payer: Self-pay

## 2020-04-02 VITALS — BP 120/73 | HR 78 | Temp 96.4°F

## 2020-04-02 DIAGNOSIS — D500 Iron deficiency anemia secondary to blood loss (chronic): Secondary | ICD-10-CM

## 2020-04-02 MED ORDER — SODIUM CHLORIDE 0.9 % IV SOLN
Freq: Once | INTRAVENOUS | Status: AC
  Filled 2020-04-02: qty 250

## 2020-04-02 MED ORDER — IRON SUCROSE 20 MG/ML IV SOLN
200.0000 mg | Freq: Once | INTRAVENOUS | Status: AC
  Administered 2020-04-02: 200 mg via INTRAVENOUS
  Filled 2020-04-02: qty 10

## 2020-04-02 NOTE — Progress Notes (Signed)
Pt tolerated infusion well. No s/s of distress or reaction noted. Pt and VS stable at discharge.  

## 2020-06-24 ENCOUNTER — Ambulatory Visit: Payer: Self-pay | Admitting: Internal Medicine

## 2020-06-24 ENCOUNTER — Other Ambulatory Visit: Payer: Self-pay

## 2020-06-24 ENCOUNTER — Ambulatory Visit: Payer: Self-pay

## 2020-07-16 IMAGING — CT CT ABDOMEN AND PELVIS WITH CONTRAST
2 of 5 series · 15 of 46 positions shown, 17 images · IV contrast (omnipaque)
Comparison: None.

CLINICAL DATA: Elevated liver function tests and intermittent
postprandial left upper quadrant abdominal pain, chronic. Iron
deficiency anemia.

EXAM:
CT ABDOMEN AND PELVIS WITH CONTRAST
TECHNIQUE: Multidetector CT imaging of the abdomen and pelvis was performed
using the standard protocol following bolus administration of
intravenous contrast.
CONTRAST:  100mL OMNIPAQUE IOHEXOL 300 MG/ML  SOLN

[Series 2: abd pelvis · axial · 0.65mm/px · z∈[-1510,-1095]mm · 12 of 95 slices shown, 14 images]
[im 6/95  soft-tissue]
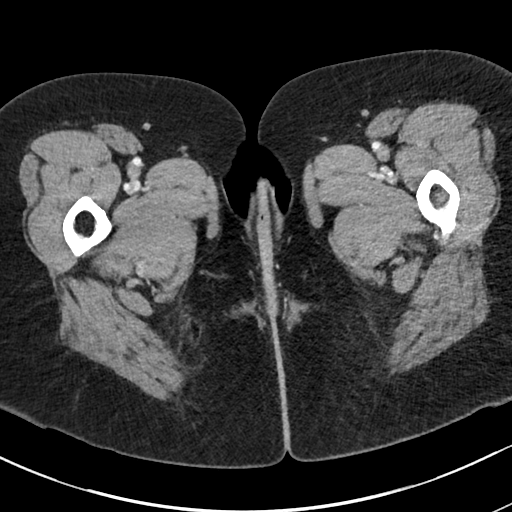
[im 6/95  bone]
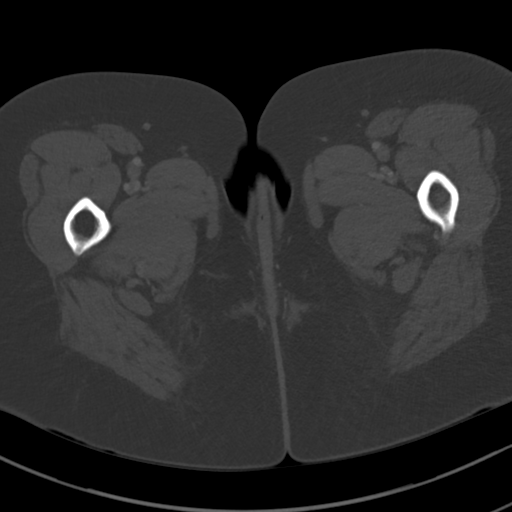
[im 17/95  soft-tissue]
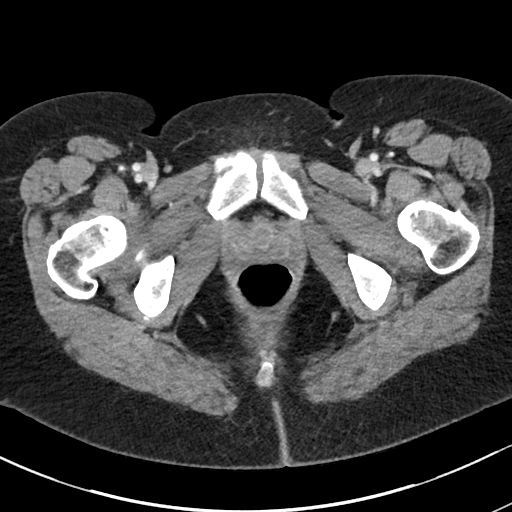
[im 23/95  soft-tissue]
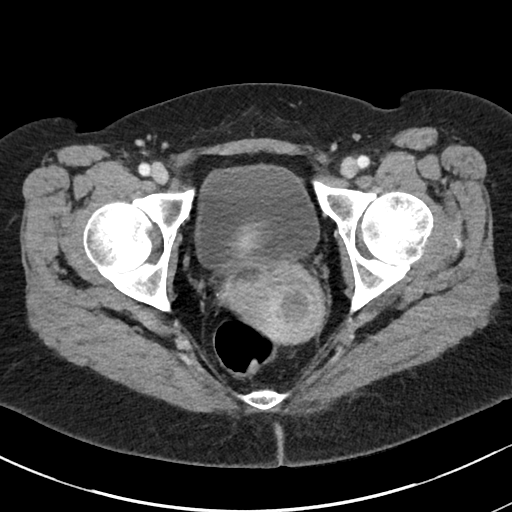
[im 28/95  soft-tissue]
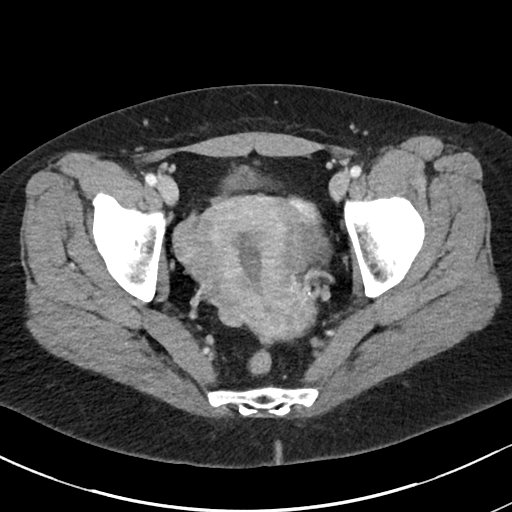
[im 39/95  soft-tissue]
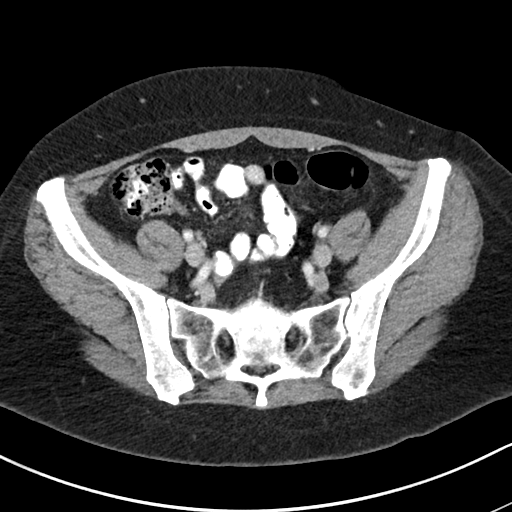
[im 45/95  soft-tissue]
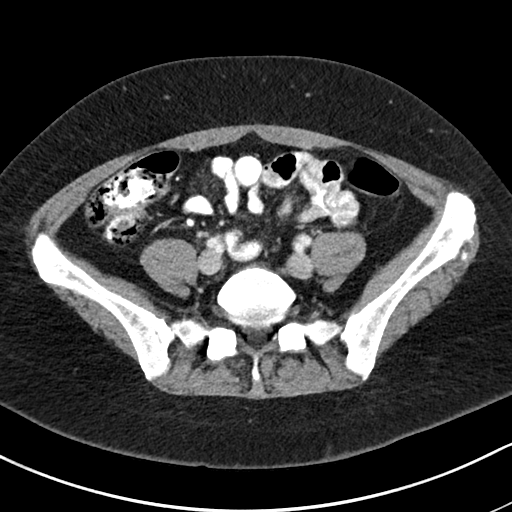
[im 50/95  soft-tissue]
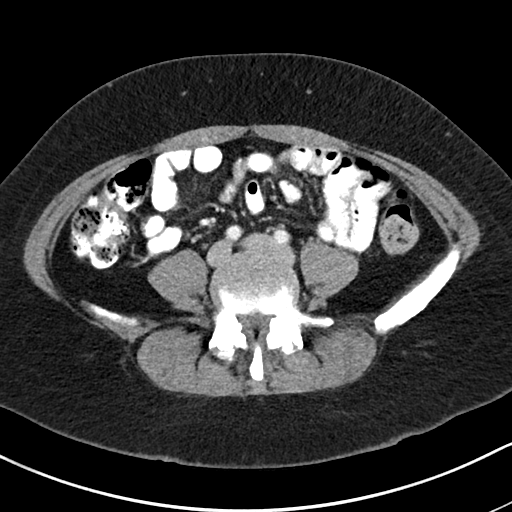
[im 61/95  soft-tissue]
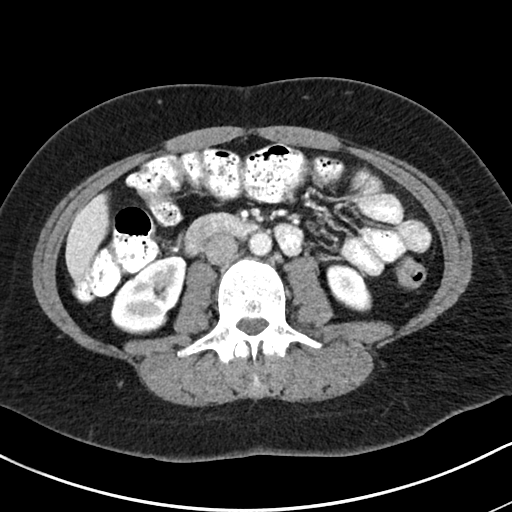
[im 67/95  soft-tissue]
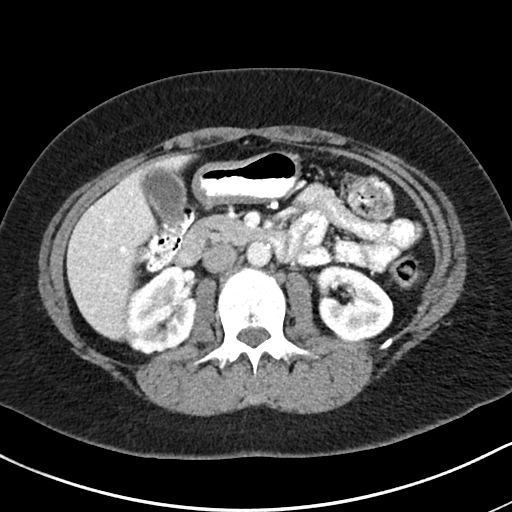
[im 67/95  bone]
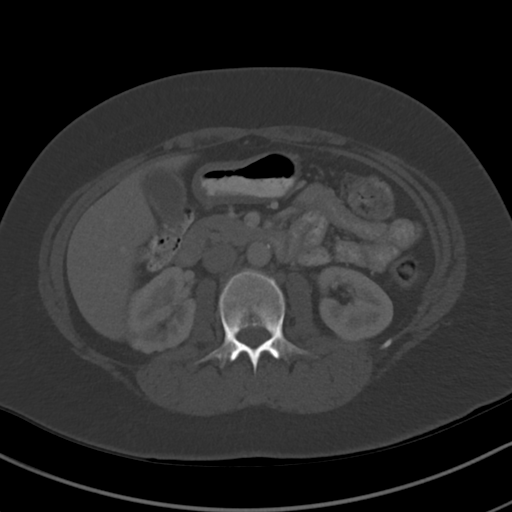
[im 72/95  soft-tissue]
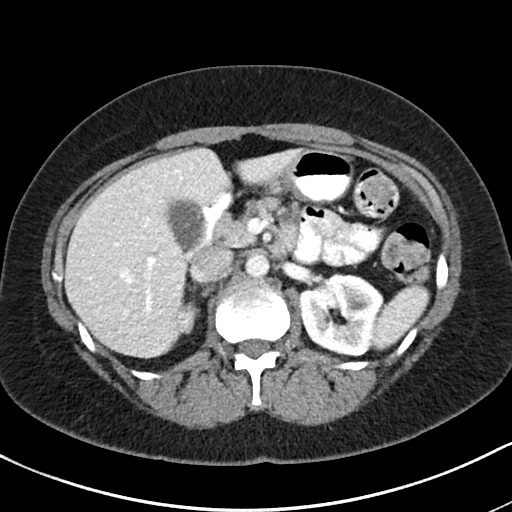
[im 83/95  soft-tissue]
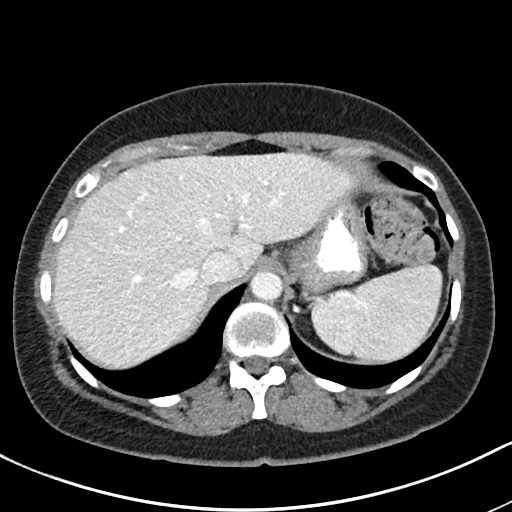
[im 89/95  soft-tissue]
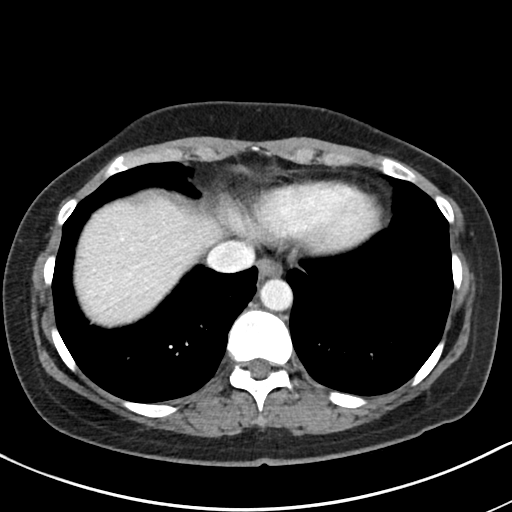

[Series 4: coronal abd pelvis · coronal · 0.65mm/px · 3 of 139 slices shown]
[im 47/139  soft-tissue]
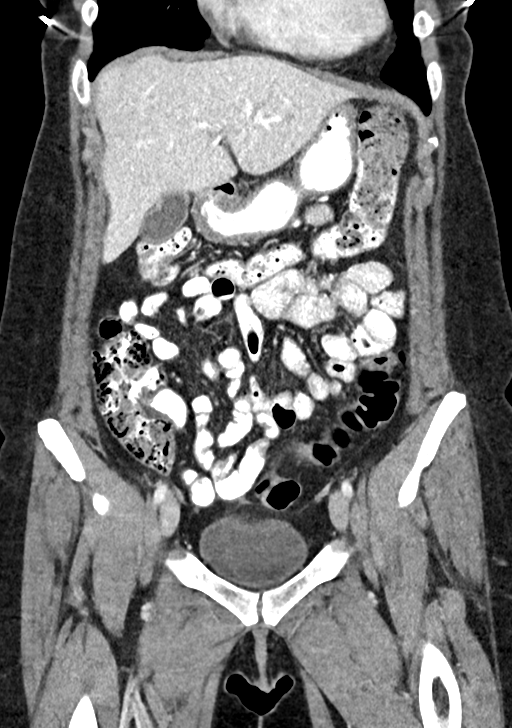
[im 62/139  soft-tissue]
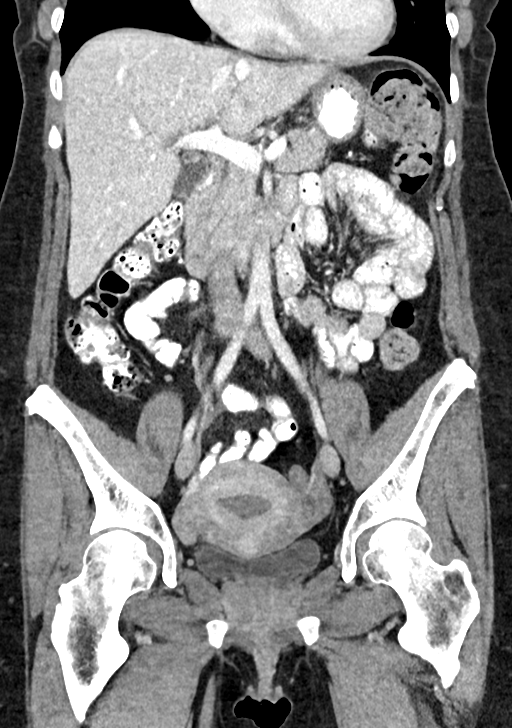
[im 77/139  soft-tissue]
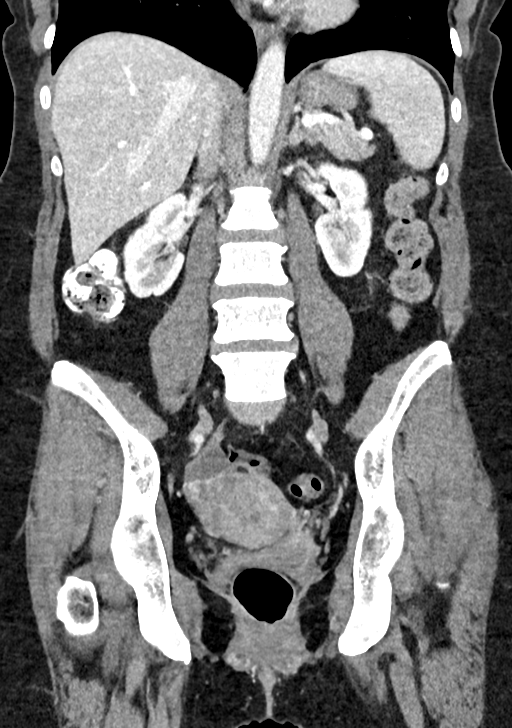

[15 of 46 positions shown; findings below may reference images not displayed]

FINDINGS: Lower chest: Unremarkable

Hepatobiliary: Unremarkable. No specific imaging cause for the
patient's elevated liver function tests are identified.

Pancreas: Focal reduction in pancreatic parenchymal thickness in the
pancreatic body for example on image [DATE].

Spleen: Unremarkable. Small amount of accessory splenic tissue along
the inferior margin of the spleen adjacent to the left kidney.

Adrenals/Urinary Tract: Scarring in the right kidney mostly along
the upper pole with adjacent punctate dystrophic calcification.

Sharply defined hypodense lesion of the left mid kidney measures
by 1.2 cm on image 55/4, this is probably a cyst but technically too
small to characterize.

Adrenal glands normal.

Stomach/Bowel: Unremarkable

Vascular/Lymphatic: Aortoiliac atherosclerotic vascular disease.

Reproductive: The cervix appears somewhat prominent and thickened.
Endometrial stripe 1.8 cm in thickness, there is a 1.2 by 1.0 by
cm likely enhancing nodule along the posterior portion of the
endometrial on image 82/6 which could be a polyp or a submucosal
fibroid. The faintly accentuated enhancement in the anterior uterine
body on image 81/6, possibly from a fibroid. Ovaries unremarkable.

Other: No supplemental non-categorized findings.

Musculoskeletal: Mild lower lumbar spondylosis and degenerative disc
disease.
IMPRESSION: 1. Abnormal findings in the uterus including a 1.2 cm likely
enhancing nodule along the posterior portion of the endometrium
which could be a polyp or submucosal fibroid; borderline thickening
of the endometrium at 1.8 cm in thickness; and the appearance of
unusual prominence of the cervix. Some of this cervical prominence
may be due to angulation of the cervix, but overall the
constellation of findings warrants further workup. Pelvic exam to
assess the cervix and ensure that there is no cervical mass is
suggested; moreover, sonography or sonohysterography of the pelvis
is likewise suggested to further assess the endometrial findings.
2. No findings in the liver to correspond with patient's elevated
liver function tests.
3. Focal reduction pancreatic parenchymal thickness in the
pancreatic body near the pancreatic head, possibly due to a prior
insult or a congenital anomaly.
4. Scarring in the right kidney especially the upper pole, chronic.
5.  Aortic Atherosclerosis ([id number]).

## 2020-12-17 ENCOUNTER — Telehealth: Payer: Self-pay | Admitting: *Deleted

## 2020-12-17 ENCOUNTER — Other Ambulatory Visit: Payer: Self-pay | Admitting: *Deleted

## 2020-12-17 DIAGNOSIS — D500 Iron deficiency anemia secondary to blood loss (chronic): Secondary | ICD-10-CM

## 2020-12-17 NOTE — Telephone Encounter (Signed)
Per Teodora Medici- patient calling to request to r/s her missed apt in September for lab/md/venofer.  apts r/s. Colette called patient back and left a vm that call was being returned.

## 2020-12-30 ENCOUNTER — Other Ambulatory Visit: Payer: Self-pay

## 2021-01-01 ENCOUNTER — Ambulatory Visit: Payer: Self-pay

## 2021-01-01 ENCOUNTER — Ambulatory Visit: Payer: Self-pay | Admitting: Internal Medicine

## 2021-12-15 ENCOUNTER — Telehealth: Payer: Self-pay | Admitting: *Deleted

## 2021-12-15 NOTE — Telephone Encounter (Signed)
Patient requests appointment for iron infusions. ?

## 2021-12-20 ENCOUNTER — Telehealth: Payer: Self-pay | Admitting: *Deleted

## 2021-12-20 NOTE — Telephone Encounter (Signed)
Patient needs to reschedule appointment. ?

## 2021-12-21 ENCOUNTER — Telehealth: Payer: Self-pay | Admitting: *Deleted

## 2021-12-21 NOTE — Telephone Encounter (Signed)
Patient needs to reschedule appointment she scheduled earlier this week. ?

## 2021-12-22 ENCOUNTER — Inpatient Hospital Stay: Payer: Self-pay

## 2021-12-22 ENCOUNTER — Other Ambulatory Visit: Payer: Self-pay | Admitting: *Deleted

## 2021-12-22 ENCOUNTER — Inpatient Hospital Stay: Payer: Self-pay | Admitting: Oncology

## 2021-12-22 ENCOUNTER — Encounter: Payer: Self-pay | Admitting: Internal Medicine

## 2021-12-22 DIAGNOSIS — D500 Iron deficiency anemia secondary to blood loss (chronic): Secondary | ICD-10-CM

## 2021-12-29 ENCOUNTER — Encounter: Payer: Self-pay | Admitting: Oncology

## 2021-12-29 ENCOUNTER — Inpatient Hospital Stay (HOSPITAL_BASED_OUTPATIENT_CLINIC_OR_DEPARTMENT_OTHER): Payer: Self-pay | Admitting: Oncology

## 2021-12-29 ENCOUNTER — Other Ambulatory Visit: Payer: Self-pay

## 2021-12-29 ENCOUNTER — Inpatient Hospital Stay: Payer: Self-pay | Attending: Oncology

## 2021-12-29 ENCOUNTER — Inpatient Hospital Stay: Payer: Self-pay

## 2021-12-29 VITALS — BP 117/75 | HR 61 | Temp 98.7°F | Resp 16 | Wt 197.0 lb

## 2021-12-29 VITALS — BP 110/78 | HR 65 | Resp 18

## 2021-12-29 DIAGNOSIS — D75839 Thrombocytosis, unspecified: Secondary | ICD-10-CM | POA: Insufficient documentation

## 2021-12-29 DIAGNOSIS — D500 Iron deficiency anemia secondary to blood loss (chronic): Secondary | ICD-10-CM

## 2021-12-29 LAB — COMPREHENSIVE METABOLIC PANEL
ALT: 11 U/L (ref 0–44)
AST: 14 U/L — ABNORMAL LOW (ref 15–41)
Albumin: 4 g/dL (ref 3.5–5.0)
Alkaline Phosphatase: 72 U/L (ref 38–126)
Anion gap: 9 (ref 5–15)
BUN: 15 mg/dL (ref 6–20)
CO2: 23 mmol/L (ref 22–32)
Calcium: 8.6 mg/dL — ABNORMAL LOW (ref 8.9–10.3)
Chloride: 103 mmol/L (ref 98–111)
Creatinine, Ser: 1 mg/dL (ref 0.44–1.00)
GFR, Estimated: >60 mL/min (ref >60)
Glucose, Bld: 102 mg/dL — ABNORMAL HIGH (ref 70–99)
Potassium: 3.4 mmol/L — ABNORMAL LOW (ref 3.5–5.1)
Sodium: 135 mmol/L (ref 135–145)
Total Bilirubin: 0.8 mg/dL (ref 0.3–1.2)
Total Protein: 8.1 g/dL (ref 6.5–8.1)

## 2021-12-29 LAB — CBC WITH DIFFERENTIAL/PLATELET
Abs Immature Granulocytes: 0.02 10*3/uL (ref 0.00–0.07)
Basophils Absolute: 0.1 10*3/uL (ref 0.0–0.1)
Basophils Relative: 1 %
Eosinophils Absolute: 0.2 10*3/uL (ref 0.0–0.5)
Eosinophils Relative: 3 %
HCT: 31.7 % — ABNORMAL LOW (ref 36.0–46.0)
Hemoglobin: 9.5 g/dL — ABNORMAL LOW (ref 12.0–15.0)
Immature Granulocytes: 0 %
Lymphocytes Relative: 22 %
Lymphs Abs: 1.7 10*3/uL (ref 0.7–4.0)
MCH: 22.9 pg — ABNORMAL LOW (ref 26.0–34.0)
MCHC: 30 g/dL (ref 30.0–36.0)
MCV: 76.6 fL — ABNORMAL LOW (ref 80.0–100.0)
Monocytes Absolute: 0.6 10*3/uL (ref 0.1–1.0)
Monocytes Relative: 7 %
Neutro Abs: 5.1 10*3/uL (ref 1.7–7.7)
Neutrophils Relative %: 67 %
Platelets: 533 10*3/uL — ABNORMAL HIGH (ref 150–400)
RBC: 4.14 MIL/uL (ref 3.87–5.11)
RDW: 17.6 % — ABNORMAL HIGH (ref 11.5–15.5)
WBC: 7.6 10*3/uL (ref 4.0–10.5)
nRBC: 0 % (ref 0.0–0.2)

## 2021-12-29 LAB — IRON AND TIBC
Iron: 14 ug/dL — ABNORMAL LOW (ref 28–170)
Saturation Ratios: 3 % — ABNORMAL LOW (ref 10.4–31.8)
TIBC: 462 ug/dL — ABNORMAL HIGH (ref 250–450)
UIBC: 448 ug/dL

## 2021-12-29 LAB — FERRITIN: Ferritin: 4 ng/mL — ABNORMAL LOW (ref 11–307)

## 2021-12-29 MED ORDER — SODIUM CHLORIDE 0.9 % IV SOLN
Freq: Once | INTRAVENOUS | Status: AC
  Filled 2021-12-29: qty 250

## 2021-12-29 MED ORDER — IRON SUCROSE 20 MG/ML IV SOLN
200.0000 mg | Freq: Once | INTRAVENOUS | Status: AC
  Administered 2021-12-29: 200 mg via INTRAVENOUS
  Filled 2021-12-29: qty 10

## 2021-12-29 NOTE — Progress Notes (Signed)
Patient reports no energy and did have an improvement with last iron infusions 1 year ago. ?

## 2021-12-29 NOTE — Patient Instructions (Signed)

## 2021-12-29 NOTE — Progress Notes (Signed)
[facility] ?CONSULT NOTE ? ?Patient Care Team: ?Jasmin Morgan, MD as PCP - General (Family Medicine) ? ?CHIEF COMPLAINTS/PURPOSE OF CONSULTATION: Iron deficiency anemia ? ?#Hx of IDA [>20 years] Iron deficiency Anemia: [hb8.3/ ferritin- 5 /PCP] s/p IV venofer; improved; CT scan August 2020-fibroid ? ?#August 2020 CT scan-fibroid [previous biopsy with PCP; unsuccessful]; SEP 2020- GI- no show [no EGD/colo] ? ? ?Oncology History  ? No history exists.  ? ? ? ?HISTORY OF PRESENTING ILLNESS:  ? ?Jasmin Molina is a 56 year old female who is here for follow-up for iron deficiency anemia.  She last received IV iron in June 2021.  She has missed several appointments and over the past 2 months started to develop worsening fatigue, shortness of breath and weakness.  She scheduled an appointment for follow-up with labs and possible IV iron. ? ?Has not been evaluated by gastroenterology.  Denies any dark or tarry stools.  No heavy menstrual cycles. ? ?Review of Systems  ?Constitutional:  Positive for malaise/fatigue.  ?Respiratory:  Positive for shortness of breath.   ?Neurological:  Positive for weakness.  ? ?MEDICAL HISTORY:  ?History reviewed. No pertinent past medical history. ? ?SURGICAL HISTORY: ?History reviewed. No pertinent surgical history. ? ?SOCIAL HISTORY:  ?Social History  ? ?Socioeconomic History  ? Marital status: Single  ?  Spouse name: Not on file  ? Number of children: Not on file  ? Years of education: Not on file  ? Highest education level: Not on file  ?Occupational History  ? Not on file  ?Tobacco Use  ? Smoking status: Never  ? Smokeless tobacco: Never  ?Substance and Sexual Activity  ? Alcohol use: No  ? Drug use: No  ? Sexual activity: Not on file  ?Other Topics Concern  ? Not on file  ?Social History Narrative  ? 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   ? ?Social Determinants of Health  ? ?Financial Resource Strain: Not on file  ?Food Insecurity: Not on file   ?Transportation Needs: Not on file  ?Physical Activity: Not on file  ?Stress: Not on file  ?Social Connections: Not on file  ?Intimate Partner Violence: Not on file  ? ? ?FAMILY HISTORY:  ?Family History  ?Problem Relation Age of Onset  ? Cancer Maternal Uncle   ?     Prostate cancer  ? Cancer Paternal Uncle   ?     Prostate cancer  ? ? ?ALLERGIES:  is allergic to codeine. ? ?MEDICATIONS:  ?Current Outpatient Medications  ?Medication Sig Dispense Refill  ? cyanocobalamin (,VITAMIN B-12,) 1000 MCG/ML injection Inject 1,000 mcg into the muscle once.    ? escitalopram (LEXAPRO) 10 MG tablet Take 10 mg by mouth daily.    ? ibuprofen (ADVIL,MOTRIN) 800 MG tablet Take 800 mg by mouth as needed.    ? levothyroxine (SYNTHROID, LEVOTHROID) 150 MCG tablet Take 125 mcg by mouth daily before breakfast.     ? ?No current facility-administered medications for this visit.  ? ? ?  ?. ? ?PHYSICAL EXAMINATION: ?ECOG PERFORMANCE STATUS: 0 - Asymptomatic ? ?Vitals:  ? 12/29/21 1400  ?BP: 117/75  ?Pulse: 61  ?Resp: 16  ?Temp: 98.7 ?F (37.1 ?C)  ? ?Filed Weights  ? 12/29/21 1400  ?Weight: 197 lb (89.4 kg)  ? ? ?Physical Exam ?Constitutional:   ?   Appearance: Normal appearance.  ?HENT:  ?   Head: Normocephalic and atraumatic.  ?Eyes:  ?   Pupils: Pupils are equal, round, and reactive  to light.  ?Cardiovascular:  ?   Rate and Rhythm: Normal rate and regular rhythm.  ?   Heart sounds: Normal heart sounds. No murmur heard. ?Pulmonary:  ?   Effort: Pulmonary effort is normal.  ?   Breath sounds: Normal breath sounds. No wheezing.  ?Abdominal:  ?   General: Bowel sounds are normal. There is no distension.  ?   Palpations: Abdomen is soft.  ?   Tenderness: There is no abdominal tenderness.  ?Musculoskeletal:     ?   General: Normal range of motion.  ?   Cervical back: Normal range of motion.  ?Skin: ?   General: Skin is warm and dry.  ?   Findings: No rash.  ?Neurological:  ?   Mental Status: She is alert and oriented to person, place, and  time.  ?   Gait: Gait is intact.  ?Psychiatric:     ?   Mood and Affect: Mood and affect normal.     ?   Cognition and Memory: Memory normal.     ?   Judgment: Judgment normal.  ? ? ? ?LABORATORY DATA:  ?I have reviewed the data as listed ?Lab Results  ?Component Value Date  ? WBC 7.6 12/29/2021  ? HGB 9.5 (L) 12/29/2021  ? HCT 31.7 (L) 12/29/2021  ? MCV 76.6 (L) 12/29/2021  ? PLT 533 (H) 12/29/2021  ? ?Recent Labs  ?  12/29/21 ?1406  ?NA 135  ?K 3.4*  ?CL 103  ?CO2 23  ?GLUCOSE 102*  ?BUN 15  ?CREATININE 1.00  ?CALCIUM 8.6*  ?GFRNONAA >60  ?PROT 8.1  ?ALBUMIN 4.0  ?AST 14*  ?ALT 11  ?ALKPHOS 72  ?BILITOT 0.8  ? ? ? ?RADIOGRAPHIC STUDIES: ?I have personally reviewed the radiological images as listed and agreed with the findings in the report. ?No results found. ? ?ASSESSMENT & PLAN:  ?Iron deficiency anemia due to chronic blood loss ?Reviewed lab work from 12/29/2021 which shows a hemoglobin of 9.5, ferritin of 4 and iron saturation 3%.  Recommend 5 doses of IV Venofer.  Unable to tolerate oral iron.  Recommend follow-up with GI. ? ?Thrombocytosis-likely secondary to iron deficiency anemia.  We will recheck in 3 months.   ? ?Disposition- ?Proceed with IV Venofer today.  Patient to be scheduled for 4 additional infusion. RTC in 3 months for follow-up with Dr. Donneta Romberg, labs and possible IV Venofer. ? ? I spent 25 minutes dedicated to the care of this patient (face-to-face and non-face-to-face) on the date of the encounter to include what is described in the assessment and plan. ? ?No problem-specific Assessment & Plan notes found for this encounter. ? ?All questions were answered. The patient knows to call the clinic with any problems, questions or concerns. ?  ? Jasmin Kaufmann, NP ?12/29/2021 2:42 PM ? ? ?

## 2021-12-30 ENCOUNTER — Encounter: Payer: Self-pay | Admitting: Internal Medicine

## 2022-01-11 ENCOUNTER — Inpatient Hospital Stay: Payer: Self-pay | Attending: Internal Medicine

## 2022-01-11 VITALS — BP 130/73 | HR 60 | Temp 96.8°F | Resp 16

## 2022-01-11 DIAGNOSIS — D500 Iron deficiency anemia secondary to blood loss (chronic): Secondary | ICD-10-CM | POA: Insufficient documentation

## 2022-01-11 MED ORDER — IRON SUCROSE 20 MG/ML IV SOLN
200.0000 mg | Freq: Once | INTRAVENOUS | Status: AC
  Administered 2022-01-11: 200 mg via INTRAVENOUS
  Filled 2022-01-11: qty 10

## 2022-01-11 MED ORDER — SODIUM CHLORIDE 0.9 % IV SOLN
Freq: Once | INTRAVENOUS | Status: AC
  Filled 2022-01-11: qty 250

## 2022-01-13 ENCOUNTER — Inpatient Hospital Stay: Payer: Self-pay

## 2022-01-13 VITALS — BP 122/75 | HR 56 | Temp 98.0°F | Resp 18

## 2022-01-13 DIAGNOSIS — D500 Iron deficiency anemia secondary to blood loss (chronic): Secondary | ICD-10-CM

## 2022-01-13 MED ORDER — SODIUM CHLORIDE 0.9 % IV SOLN
Freq: Once | INTRAVENOUS | Status: AC
  Filled 2022-01-13: qty 250

## 2022-01-13 MED ORDER — IRON SUCROSE 20 MG/ML IV SOLN
200.0000 mg | Freq: Once | INTRAVENOUS | Status: AC
  Administered 2022-01-13: 200 mg via INTRAVENOUS
  Filled 2022-01-13: qty 10

## 2022-01-13 NOTE — Patient Instructions (Signed)

## 2022-01-17 ENCOUNTER — Inpatient Hospital Stay: Payer: Self-pay

## 2022-01-17 VITALS — BP 114/84 | HR 56 | Temp 96.0°F | Resp 17

## 2022-01-17 DIAGNOSIS — D500 Iron deficiency anemia secondary to blood loss (chronic): Secondary | ICD-10-CM

## 2022-01-17 MED ORDER — SODIUM CHLORIDE 0.9 % IV SOLN
Freq: Once | INTRAVENOUS | Status: AC
  Filled 2022-01-17: qty 250

## 2022-01-17 MED ORDER — IRON SUCROSE 20 MG/ML IV SOLN
200.0000 mg | Freq: Once | INTRAVENOUS | Status: AC
  Administered 2022-01-17: 200 mg via INTRAVENOUS

## 2022-01-17 NOTE — Patient Instructions (Signed)

## 2022-01-17 NOTE — Progress Notes (Signed)
Patient tolerated Venofer infusion well today, no concerns voiced. Patient stable at discharge. Refused AVS .   ?

## 2022-01-19 ENCOUNTER — Inpatient Hospital Stay: Payer: Self-pay

## 2022-01-19 VITALS — BP 129/78 | HR 56 | Temp 97.8°F | Resp 18

## 2022-01-19 DIAGNOSIS — D500 Iron deficiency anemia secondary to blood loss (chronic): Secondary | ICD-10-CM

## 2022-01-19 MED ORDER — IRON SUCROSE 20 MG/ML IV SOLN
200.0000 mg | Freq: Once | INTRAVENOUS | Status: AC
  Administered 2022-01-19: 200 mg via INTRAVENOUS
  Filled 2022-01-19: qty 10

## 2022-01-19 MED ORDER — SODIUM CHLORIDE 0.9 % IV SOLN
Freq: Once | INTRAVENOUS | Status: AC
  Filled 2022-01-19: qty 250

## 2022-01-19 NOTE — Patient Instructions (Signed)

## 2022-04-01 ENCOUNTER — Inpatient Hospital Stay: Payer: Self-pay

## 2022-04-01 ENCOUNTER — Inpatient Hospital Stay: Payer: Self-pay | Admitting: Internal Medicine

## 2022-04-19 ENCOUNTER — Encounter: Payer: Self-pay | Admitting: Internal Medicine

## 2022-04-19 ENCOUNTER — Inpatient Hospital Stay: Payer: Self-pay | Attending: Internal Medicine

## 2022-04-19 ENCOUNTER — Inpatient Hospital Stay (HOSPITAL_BASED_OUTPATIENT_CLINIC_OR_DEPARTMENT_OTHER): Payer: Self-pay | Admitting: Internal Medicine

## 2022-04-19 ENCOUNTER — Inpatient Hospital Stay: Payer: Self-pay

## 2022-04-19 VITALS — BP 125/85 | HR 80 | Temp 99.1°F | Resp 16 | Wt 201.2 lb

## 2022-04-19 VITALS — BP 123/82 | HR 63 | Temp 98.7°F

## 2022-04-19 DIAGNOSIS — D500 Iron deficiency anemia secondary to blood loss (chronic): Secondary | ICD-10-CM

## 2022-04-19 LAB — IRON AND TIBC
Iron: 91 ug/dL (ref 28–170)
Saturation Ratios: 26 % (ref 10.4–31.8)
TIBC: 346 ug/dL (ref 250–450)
UIBC: 255 ug/dL

## 2022-04-19 LAB — COMPREHENSIVE METABOLIC PANEL
ALT: 25 U/L (ref 0–44)
AST: 17 U/L (ref 15–41)
Albumin: 4 g/dL (ref 3.5–5.0)
Alkaline Phosphatase: 104 U/L (ref 38–126)
Anion gap: 7 (ref 5–15)
BUN: 23 mg/dL — ABNORMAL HIGH (ref 6–20)
CO2: 25 mmol/L (ref 22–32)
Calcium: 9 mg/dL (ref 8.9–10.3)
Chloride: 106 mmol/L (ref 98–111)
Creatinine, Ser: 0.92 mg/dL (ref 0.44–1.00)
GFR, Estimated: >60 mL/min (ref >60)
Glucose, Bld: 88 mg/dL (ref 70–99)
Potassium: 3.8 mmol/L (ref 3.5–5.1)
Sodium: 138 mmol/L (ref 135–145)
Total Bilirubin: 1.1 mg/dL (ref 0.3–1.2)
Total Protein: 7.8 g/dL (ref 6.5–8.1)

## 2022-04-19 LAB — CBC WITH DIFFERENTIAL/PLATELET
Abs Immature Granulocytes: 0.02 10*3/uL (ref 0.00–0.07)
Basophils Absolute: 0 10*3/uL (ref 0.0–0.1)
Basophils Relative: 1 %
Eosinophils Absolute: 0.3 10*3/uL (ref 0.0–0.5)
Eosinophils Relative: 5 %
HCT: 41.2 % (ref 36.0–46.0)
Hemoglobin: 13.4 g/dL (ref 12.0–15.0)
Immature Granulocytes: 0 %
Lymphocytes Relative: 21 %
Lymphs Abs: 1.3 10*3/uL (ref 0.7–4.0)
MCH: 28.9 pg (ref 26.0–34.0)
MCHC: 32.5 g/dL (ref 30.0–36.0)
MCV: 89 fL (ref 80.0–100.0)
Monocytes Absolute: 0.5 10*3/uL (ref 0.1–1.0)
Monocytes Relative: 7 %
Neutro Abs: 4.1 10*3/uL (ref 1.7–7.7)
Neutrophils Relative %: 66 %
Platelets: 376 10*3/uL (ref 150–400)
RBC: 4.63 MIL/uL (ref 3.87–5.11)
RDW: 16.8 % — ABNORMAL HIGH (ref 11.5–15.5)
WBC: 6.1 10*3/uL (ref 4.0–10.5)
nRBC: 0 % (ref 0.0–0.2)

## 2022-04-19 LAB — FERRITIN: Ferritin: 32 ng/mL (ref 11–307)

## 2022-04-19 MED ORDER — SODIUM CHLORIDE 0.9 % IV SOLN
Freq: Once | INTRAVENOUS | Status: AC
  Filled 2022-04-19: qty 250

## 2022-04-19 MED ORDER — SODIUM CHLORIDE 0.9 % IV SOLN
200.0000 mg | Freq: Once | INTRAVENOUS | Status: AC
  Administered 2022-04-19: 200 mg via INTRAVENOUS
  Filled 2022-04-19: qty 10

## 2022-04-19 NOTE — Progress Notes (Signed)
Patient energy level has not improved and has episodes of dizziness.

## 2022-04-19 NOTE — Progress Notes (Signed)
Hemoglobin 13.4 per Dr. Donneta Romberg okay to proceed with Venofer.

## 2022-04-19 NOTE — Progress Notes (Signed)
[facility] CONSULT NOTE  Patient Care Team: Emogene Morgan, MD as PCP - General (Family Medicine) Earna Coder, MD as Consulting Physician (Oncology)  CHIEF COMPLAINTS/PURPOSE OF CONSULTATION: Iron deficiency anemia  #Hx of IDA [>20 years] Iron deficiency Anemia: [hb8.3/ ferritin- 5 /PCP] s/p IV venofer; improved; CT scan August 2020-fibroid  #August 2020 CT scan-fibroid [previous biopsy with PCP; unsuccessful]; SEP 2020- GI- no show [no EGD/colo]   Oncology History   No history exists.     HISTORY OF PRESENTING ILLNESS: Alone ambulating dependently. Jasmin Molina 56 y.o.  female with a history of iron deficient anemia of unclear etiology is here for follow-up.    Patient last received IV iron infusions in April 2023.  Energy levels improved however still fatigued.  No blood in stools or black or stools.  Patient concerned about perimenopausal symptoms-hot flashes cold sweats etc.  Review of Systems  Constitutional:  Positive for malaise/fatigue. Negative for chills, diaphoresis, fever and weight loss.  HENT:  Negative for nosebleeds and sore throat.   Eyes:  Negative for double vision.  Respiratory:  Negative for cough, hemoptysis, sputum production, shortness of breath and wheezing.   Cardiovascular:  Negative for chest pain, palpitations, orthopnea and leg swelling.  Gastrointestinal:  Negative for abdominal pain, blood in stool, constipation, diarrhea, heartburn, melena, nausea and vomiting.  Genitourinary:  Negative for dysuria, frequency and urgency.  Musculoskeletal:  Negative for back pain and joint pain.  Skin: Negative.  Negative for itching and rash.  Neurological:  Negative for dizziness, tingling, focal weakness, weakness and headaches.  Endo/Heme/Allergies:  Does not bruise/bleed easily.  Psychiatric/Behavioral:  Negative for depression. The patient is not nervous/anxious and does not have insomnia.     MEDICAL HISTORY:  History  reviewed. No pertinent past medical history.  SURGICAL HISTORY: History reviewed. No pertinent surgical history.  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    Social Determinants of Health   Financial Resource Strain: Low Risk  (12/27/2017)   Overall Financial Resource Strain (CARDIA)    Difficulty of Paying Living Expenses: Not hard at all  Food Insecurity: No Food Insecurity (12/27/2017)   Hunger Vital Sign    Worried About Running Out of Food in the Last Year: Never true    Ran Out of Food in the Last Year: Never true  Transportation Needs: No Transportation Needs (12/27/2017)   PRAPARE - Administrator, Civil Service (Medical): No    Lack of Transportation (Non-Medical): No  Physical Activity: Not on file  Stress: Stress Concern Present (12/27/2017)   [employer] of Occupational Health - Occupational Stress Questionnaire    Feeling of Stress : Rather much  Social Connections: Not on file  Intimate Partner Violence: Not on file    FAMILY HISTORY:  Family History  Problem Relation Age of Onset   Cancer Maternal Uncle  Prostate cancer   Cancer Paternal Uncle        Prostate cancer    ALLERGIES:  is allergic to codeine.  MEDICATIONS:  Current Outpatient Medications  Medication Sig Dispense Refill   cyanocobalamin (,VITAMIN B-12,) 1000 MCG/ML injection Inject 1,000 mcg into the muscle once.     ibuprofen (ADVIL,MOTRIN) 800 MG tablet Take 800 mg by  mouth as needed.     levothyroxine (SYNTHROID) 125 MCG tablet Take 125 mcg by mouth daily.     escitalopram (LEXAPRO) 10 MG tablet Take 10 mg by mouth daily. (Patient not taking: Reported on 04/19/2022)     No current facility-administered medications for this visit.      Marland Kitchen  PHYSICAL EXAMINATION: ECOG PERFORMANCE STATUS: 0 - Asymptomatic  Vitals:   04/19/22 1300  BP: 125/85  Pulse: 80  Resp: 16  Temp: 99.1 F (37.3 C)   Filed Weights   04/19/22 1300  Weight: 201 lb 3.2 oz (91.3 kg)    Physical Exam HENT:     Head: Normocephalic and atraumatic.     Mouth/Throat:     Pharynx: No oropharyngeal exudate.  Eyes:     Pupils: Pupils are equal, round, and reactive to light.  Cardiovascular:     Rate and Rhythm: Normal rate and regular rhythm.  Pulmonary:     Effort: No respiratory distress.     Breath sounds: No wheezing.  Abdominal:     General: Bowel sounds are normal. There is no distension.     Palpations: Abdomen is soft. There is no mass.     Tenderness: There is no abdominal tenderness. There is no guarding or rebound.  Musculoskeletal:        General: No tenderness. Normal range of motion.     Cervical back: Normal range of motion and neck supple.  Skin:    General: Skin is warm.  Neurological:     Mental Status: She is alert and oriented to person, place, and time.  Psychiatric:        Mood and Affect: Affect normal.      LABORATORY DATA:  I have reviewed the data as listed Lab Results  Component Value Date   WBC 6.1 04/19/2022   HGB 13.4 04/19/2022   HCT 41.2 04/19/2022   MCV 89.0 04/19/2022   PLT 376 04/19/2022   Recent Labs    12/29/21 1406 04/19/22 1247  NA 135 138  K 3.4* 3.8  CL 103 106  CO2 23 25  GLUCOSE 102* 88  BUN 15 23*  CREATININE 1.00 0.92  CALCIUM 8.6* 9.0  GFRNONAA >60 >60  PROT 8.1 7.8  ALBUMIN 4.0 4.0  AST 14* 17  ALT 11 25  ALKPHOS 72 104  BILITOT 0.8 1.1    RADIOGRAPHIC STUDIES: I have personally reviewed the  radiological images as listed and agreed with the findings in the report. No results found.  ASSESSMENT & PLAN:   Iron deficiency anemia due to chronic blood loss #Iron deficiency anemia-unclear etiology-today hemoglobin is 13.2.  Patient is symptomatic with fatigue.  Proceed with IV iron today.  Iron studies pending.   #Etiology: Anemia unclear- CT AUG 2020- NED- Recommend reevaluation with GI-patient declines.  No current menorrhagia.  # Menopausal symptoms: defer to PCP re: work up.   # DISPOSITION:  # venofer today # follow up in late FEB 2024-/labs-cbc/bmp iron studies; ferritin; Posisble IV Venofer-Dr.B.  Cc: Dr.Aycock-.   All questions were answered. The patient knows to call the clinic with  any problems, questions or concerns.    Earna Coder, MD 04/19/2022 1:46 PM

## 2022-04-19 NOTE — Assessment & Plan Note (Addendum)
#  Iron deficiency anemia-unclear etiology-today hemoglobin is 13.2.  Patient is symptomatic with fatigue.  Proceed with IV iron today.  Iron studies pending.   #Etiology: Anemia unclear- CT AUG 2020- NED- Recommend reevaluation with GI-patient declines.  No current menorrhagia.  # Menopausal symptoms: defer to PCP re: work up.   # DISPOSITION:  # venofer today # follow up in late FEB 2024-/labs-cbc/bmp iron studies; ferritin; Posisble IV Venofer-Dr.B.  Cc: Dr.Aycock-.

## 2022-05-18 ENCOUNTER — Encounter: Payer: Self-pay | Admitting: Internal Medicine

## 2022-11-01 ENCOUNTER — Encounter: Payer: Self-pay | Admitting: Internal Medicine

## 2022-11-29 MED FILL — Iron Sucrose Inj 20 MG/ML (Fe Equiv): INTRAVENOUS | Qty: 10 | Status: AC

## 2022-11-30 ENCOUNTER — Inpatient Hospital Stay: Payer: 59 | Attending: Internal Medicine

## 2022-11-30 ENCOUNTER — Encounter: Payer: Self-pay | Admitting: Internal Medicine

## 2022-11-30 ENCOUNTER — Inpatient Hospital Stay (HOSPITAL_BASED_OUTPATIENT_CLINIC_OR_DEPARTMENT_OTHER): Payer: 59 | Admitting: Internal Medicine

## 2022-11-30 ENCOUNTER — Inpatient Hospital Stay: Payer: 59

## 2022-11-30 DIAGNOSIS — D500 Iron deficiency anemia secondary to blood loss (chronic): Secondary | ICD-10-CM | POA: Diagnosis not present

## 2022-11-30 LAB — CBC WITH DIFFERENTIAL/PLATELET
Abs Immature Granulocytes: 0.02 10*3/uL (ref 0.00–0.07)
Basophils Absolute: 0.1 10*3/uL (ref 0.0–0.1)
Basophils Relative: 1 %
Eosinophils Absolute: 0.3 10*3/uL (ref 0.0–0.5)
Eosinophils Relative: 3 %
HCT: 44.8 % (ref 36.0–46.0)
Hemoglobin: 14.8 g/dL (ref 12.0–15.0)
Immature Granulocytes: 0 %
Lymphocytes Relative: 21 %
Lymphs Abs: 1.8 10*3/uL (ref 0.7–4.0)
MCH: 30.6 pg (ref 26.0–34.0)
MCHC: 33 g/dL (ref 30.0–36.0)
MCV: 92.6 fL (ref 80.0–100.0)
Monocytes Absolute: 0.4 10*3/uL (ref 0.1–1.0)
Monocytes Relative: 5 %
Neutro Abs: 5.8 10*3/uL (ref 1.7–7.7)
Neutrophils Relative %: 70 %
Platelets: 389 10*3/uL (ref 150–400)
RBC: 4.84 MIL/uL (ref 3.87–5.11)
RDW: 13.1 % (ref 11.5–15.5)
WBC: 8.4 10*3/uL (ref 4.0–10.5)
nRBC: 0 % (ref 0.0–0.2)

## 2022-11-30 LAB — BASIC METABOLIC PANEL
Anion gap: 11 (ref 5–15)
BUN: 21 mg/dL — ABNORMAL HIGH (ref 6–20)
CO2: 22 mmol/L (ref 22–32)
Calcium: 9.1 mg/dL (ref 8.9–10.3)
Chloride: 104 mmol/L (ref 98–111)
Creatinine, Ser: 0.99 mg/dL (ref 0.44–1.00)
GFR, Estimated: >60 mL/min (ref >60)
Glucose, Bld: 93 mg/dL (ref 70–99)
Potassium: 3.4 mmol/L — ABNORMAL LOW (ref 3.5–5.1)
Sodium: 137 mmol/L (ref 135–145)

## 2022-11-30 LAB — IRON AND TIBC
Iron: 102 ug/dL (ref 28–170)
Saturation Ratios: 30 % (ref 10.4–31.8)
TIBC: 339 ug/dL (ref 250–450)
UIBC: 237 ug/dL

## 2022-11-30 LAB — FERRITIN: Ferritin: 40 ng/mL (ref 11–307)

## 2022-11-30 NOTE — Progress Notes (Signed)
[facility] CONSULT NOTE  Patient Care Team: Donnie Coffin, MD as PCP - General (Family Medicine) Cammie Sickle, MD as Consulting Physician (Oncology)  CHIEF COMPLAINTS/PURPOSE OF CONSULTATION: Iron deficiency anemia  #Hx of IDA [>20 years] Iron deficiency Anemia: [hb8.3/ ferritin- 5 /PCP] s/p IV venofer; improved; CT scan August 2020-fibroid  #August 2020 CT scan-fibroid [previous biopsy with PCP; unsuccessful]; SEP 2020- GI- no show [no EGD/colo]   Oncology History   No history exists.     HISTORY OF PRESENTING ILLNESS: Alone ambulating dependently. [place] E Pink 57 y.o.  female with a history of iron deficient anemia of unclear etiology is here for follow-up.    Patient last received IV iron infusions in August 2023.  Patient continues to feel fatigued.  Feels drained, short of breath, chest feels tight often. Slight lightheadedness.   No blood visible in stool. Appetite is good.Last menstral cycle last May or June 2023.   Review of Systems  Constitutional:  Positive for malaise/fatigue. Negative for chills, diaphoresis, fever and weight loss.  HENT:  Negative for nosebleeds and sore throat.   Eyes:  Negative for double vision.  Respiratory:  Negative for cough, hemoptysis, sputum production, shortness of breath and wheezing.   Cardiovascular:  Negative for chest pain, palpitations, orthopnea and leg swelling.  Gastrointestinal:  Negative for abdominal pain, blood in stool, constipation, diarrhea, heartburn, melena, nausea and vomiting.  Genitourinary:  Negative for dysuria, frequency and urgency.  Musculoskeletal:  Negative for back pain and joint pain.  Skin: Negative.  Negative for itching and rash.  Neurological:  Negative for dizziness, tingling, focal weakness, weakness and headaches.  Endo/Heme/Allergies:  Does not bruise/bleed easily.  Psychiatric/Behavioral:  Negative for depression. The patient is not nervous/anxious and does not have  insomnia.     MEDICAL HISTORY:  History reviewed. No pertinent past medical history.  SURGICAL HISTORY: History reviewed. No pertinent surgical history.  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    Social Determinants of Health   Financial Resource Strain: Low Risk  (12/27/2017)   Overall Financial Resource Strain (CARDIA)    Difficulty of Paying Living Expenses: Not hard at all  Food Insecurity: No Food Insecurity (12/27/2017)   Hunger Vital Sign    Worried About Running Out of Food in the Last Year: Never true    Ran Out of Food in the Last Year: Never true  Transportation Needs: No Transportation Needs (12/27/2017)   PRAPARE - Hydrologist (Medical): No    Lack of Transportation (Non-Medical): No  Physical Activity: Not on file  Stress: Stress Concern Present (12/27/2017)   [place]    Feeling of Stress : Rather much  Social Connections: Not on file  Intimate Partner Violence: Not on file    FAMILY HISTORY:  Family History  Problem Relation Age of Onset   Cancer Maternal Uncle        Prostate cancer   Cancer Paternal Uncle        Prostate cancer    ALLERGIES:  is allergic to codeine.  MEDICATIONS:  Current Outpatient Medications  Medication Sig Dispense Refill   cyanocobalamin (,VITAMIN B-12,) 1000 MCG/ML injection Inject 1,000 mcg into the muscle once.     gabapentin  (NEURONTIN) 300 MG capsule Take 300 mg by mouth daily.     ibuprofen (ADVIL,MOTRIN) 800 MG tablet Take 800 mg by mouth as needed.     levothyroxine (SYNTHROID) 125 MCG tablet Take 125 mcg by mouth daily.     escitalopram (LEXAPRO) 10 MG tablet Take 10 mg by mouth daily. (Patient not taking: Reported on 04/19/2022)     No current facility-administered medications for this visit.      Marland Kitchen  PHYSICAL EXAMINATION: ECOG PERFORMANCE STATUS: 0 - Asymptomatic  Vitals:   11/30/22 1420  BP: (!) 125/93  Pulse: 82  Resp: 18  Temp: 99 F (37.2 C)  SpO2: 100%   Filed Weights   11/30/22 1420  Weight: 208 lb 12.8 oz (94.7 kg)    Physical Exam HENT:     Head: Normocephalic and atraumatic.     Mouth/Throat:     Pharynx: No oropharyngeal exudate.  Eyes:     Pupils: Pupils are equal, round, and reactive to light.  Cardiovascular:     Rate and Rhythm: Normal rate and regular rhythm.  Pulmonary:     Effort: No respiratory distress.     Breath sounds: No wheezing.  Abdominal:     General: Bowel sounds are normal. There is no distension.     Palpations: Abdomen is soft. There is no mass.     Tenderness: There is no abdominal tenderness. There is no guarding or rebound.  Musculoskeletal:        General: No tenderness. Normal range of motion.     Cervical back: Normal range of motion and neck supple.  Skin:    General: Skin is warm.  Neurological:     Mental Status: She is alert and oriented to person, place, and time.  Psychiatric:        Mood and Affect: Affect normal.      LABORATORY DATA:  I have reviewed the data as listed Lab Results  Component Value Date   WBC 8.4 11/30/2022   HGB 14.8 11/30/2022   HCT 44.8 11/30/2022   MCV 92.6 11/30/2022   PLT 389 11/30/2022   Recent Labs    12/29/21 1406 04/19/22 1247 11/30/22 1400  NA 135 138 137  K 3.4* 3.8 3.4*  CL 103 106 104  CO2 23 25 22  $ GLUCOSE 102* 88 93  BUN 15 23* 21*  CREATININE 1.00 0.92 0.99  CALCIUM 8.6* 9.0  9.1  GFRNONAA >60 >60 >60  PROT 8.1 7.8  --   ALBUMIN 4.0 4.0  --   AST 14* 17  --   ALT 11 25  --   ALKPHOS 72 104  --   BILITOT 0.8 1.1  --     RADIOGRAPHIC STUDIES: I have personally reviewed the radiological images as listed and agreed with the findings in the report. No results found.  ASSESSMENT & PLAN:   Iron deficiency anemia due to chronic blood loss #Iron deficiency anemia-unclear etiology-today hemoglobin is 14.7; HOLD IV iron today.  Iron studies pending.   #Etiology: Anemia unclear- CT AUG 2020- NED-  Recommend reevaluation with GI-patient declines.  No current menorrhagia.  # Fatigue-unlikely related to anemia.  Question anxiety/depression versus others like? OSA- defer to PCP re: sleep study.    # DISPOSITION:  # HOLD venofer today # follow up in 6 months-labs-cbc/bmp iron studies; ferritin; Posisble IV Venofer-Dr.B.  Cc: Dr.Aycock-.   All questions were answered. The patient knows to call the clinic with any problems, questions or concerns.    Cammie Sickle, MD 11/30/2022 3:02 PM

## 2022-11-30 NOTE — Assessment & Plan Note (Addendum)
#  Iron deficiency anemia-unclear etiology-today hemoglobin is 14.7; HOLD IV iron today.  Iron studies pending.   #Etiology: Anemia unclear- CT AUG 2020- NED- Recommend reevaluation with GI-patient declines.  No current menorrhagia.  # Fatigue-unlikely related to anemia.  Question anxiety/depression versus others like? OSA- defer to PCP re: sleep study.    # DISPOSITION:  # HOLD venofer today # follow up in 6 months-labs-cbc/bmp iron studies; ferritin; Posisble IV Venofer-Dr.B.  Cc: Dr.Aycock-.

## 2022-11-30 NOTE — Progress Notes (Signed)
Feels drained, short of breath, chest feels tight often. Slight lightheadedness. No blood visible in stool. Appetite is good.Last menstral cycle last May or June 2023.

## 2022-12-22 ENCOUNTER — Ambulatory Visit: Payer: 59 | Admitting: Nurse Practitioner

## 2022-12-27 ENCOUNTER — Encounter: Payer: Self-pay | Admitting: Internal Medicine

## 2022-12-28 ENCOUNTER — Ambulatory Visit: Payer: 59 | Admitting: Nurse Practitioner

## 2023-01-02 ENCOUNTER — Ambulatory Visit: Payer: 59 | Admitting: Nurse Practitioner

## 2023-01-13 ENCOUNTER — Ambulatory Visit: Payer: 59 | Admitting: Nurse Practitioner

## 2023-01-20 ENCOUNTER — Ambulatory Visit: Payer: 59 | Admitting: Nurse Practitioner

## 2023-01-30 DIAGNOSIS — D509 Iron deficiency anemia, unspecified: Secondary | ICD-10-CM | POA: Diagnosis not present

## 2023-01-30 DIAGNOSIS — E039 Hypothyroidism, unspecified: Secondary | ICD-10-CM | POA: Diagnosis not present

## 2023-01-30 DIAGNOSIS — Z1389 Encounter for screening for other disorder: Secondary | ICD-10-CM | POA: Diagnosis not present

## 2023-01-30 DIAGNOSIS — D519 Vitamin B12 deficiency anemia, unspecified: Secondary | ICD-10-CM | POA: Diagnosis not present

## 2023-01-30 DIAGNOSIS — H60502 Unspecified acute noninfective otitis externa, left ear: Secondary | ICD-10-CM | POA: Diagnosis not present

## 2023-01-30 DIAGNOSIS — Z0000 Encounter for general adult medical examination without abnormal findings: Secondary | ICD-10-CM | POA: Diagnosis not present

## 2023-01-30 DIAGNOSIS — B029 Zoster without complications: Secondary | ICD-10-CM | POA: Diagnosis not present

## 2023-02-02 ENCOUNTER — Ambulatory Visit (INDEPENDENT_AMBULATORY_CARE_PROVIDER_SITE_OTHER): Payer: 59 | Admitting: Nurse Practitioner

## 2023-02-02 VITALS — BP 122/84 | HR 73 | Ht 66.0 in | Wt 201.4 lb

## 2023-02-02 DIAGNOSIS — D500 Iron deficiency anemia secondary to blood loss (chronic): Secondary | ICD-10-CM

## 2023-02-02 DIAGNOSIS — R5383 Other fatigue: Secondary | ICD-10-CM | POA: Diagnosis not present

## 2023-02-02 DIAGNOSIS — E039 Hypothyroidism, unspecified: Secondary | ICD-10-CM

## 2023-02-02 DIAGNOSIS — R52 Pain, unspecified: Secondary | ICD-10-CM | POA: Insufficient documentation

## 2023-02-02 NOTE — Patient Instructions (Signed)
1) Nonfasting labs today 2) Xrays 3) Follow up appt in 3 weeks, must have xrays done and in chart before appt

## 2023-02-02 NOTE — Progress Notes (Signed)
New Patient Office Visit  Subjective    Patient ID: Jasmin Molina, female    DOB: December 25, 1965  Age: 57 y.o. MRN: [id number]  CC:  Chief Complaint  Patient presents with   Establish Care    NPE    HPI Jasmin Molina presents to establish care Pt is a new patient here today from [facility].  Patient is hypothyroidism, patient is taking levothyroxine.  Vitamin B12 deficiency.  Will check both today.  Lower back pain and bilateral knees.   Outpatient Encounter Medications as of 02/02/2023  Medication Sig   cyanocobalamin (,VITAMIN B-12,) 1000 MCG/ML injection Inject 1,000 mcg into the muscle once.   gabapentin (NEURONTIN) 300 MG capsule Take 300 mg by mouth daily.   ibuprofen (ADVIL,MOTRIN) 800 MG tablet Take 800 mg by mouth as needed.   levothyroxine (SYNTHROID) 125 MCG tablet Take 125 mcg by mouth daily.   VICTOZA 18 MG/3ML SOPN Inject 2.4 mg into the skin daily.   escitalopram (LEXAPRO) 10 MG tablet Take 10 mg by mouth daily. (Patient not taking: Reported on 04/19/2022)   No facility-administered encounter medications on file as of 02/02/2023.    No past medical history on file.  No past surgical history on file.  Family History  Problem Relation Age of Onset   Cancer Maternal Uncle        Prostate cancer   Cancer Paternal Uncle        Prostate cancer    Social History   Socioeconomic History   Marital status: Single    Spouse name: Not on file   Number of children: Not on file   Years of education: Not on file   Highest education level: Not on file  Occupational History   Not on file  Tobacco Use   Smoking status: Never   Smokeless tobacco: Never  Substance and Sexual Activity   Alcohol use: No   Drug use: No   Sexual activity: Not on file  Other Topics Concern   Not on file  Social History Narrative   used to work production job; 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    Social Determinants of Health   Financial Resource Strain: Low Risk   (12/27/2017)   Overall Financial Resource Strain (CARDIA)    Difficulty of Paying Living Expenses: Not hard at all  Food Insecurity: No Food Insecurity (12/27/2017)   Hunger Vital Sign    Worried About Running Out of Food in the Last Year: Never true    Ran Out of Food in the Last Year: Never true  Transportation Needs: No Transportation Needs (12/27/2017)   PRAPARE - Administrator, Civil Service (Medical): No    Lack of Transportation (Non-Medical): No  Physical Activity: Not on file  Stress: Stress Concern Present (12/27/2017)   [employer] of Occupational Health - Occupational Stress Questionnaire    Feeling of Stress : Rather much  Social Connections: Not on file  Intimate Partner Violence: Not on file    Review of Systems  Constitutional:  Positive for malaise/fatigue.  HENT: Negative.    Eyes: Negative.   Respiratory: Negative.    Cardiovascular: Negative.   Gastrointestinal: Negative.   Genitourinary: Negative.   Musculoskeletal:  Positive for back pain, joint pain, myalgias and neck pain.  Skin: Negative.   Neurological: Negative.   Endo/Heme/Allergies: Negative.   Psychiatric/Behavioral: Negative.          Objective    BP 122/84   Pulse 73  Ht  (1.676 m)   Wt 201 lb 6.4 oz (91.4 kg)   LMP 03/10/2022   SpO2 99%   BMI 32.51 kg/m   Physical Exam Vitals reviewed.  Constitutional:      Appearance: Normal appearance.  HENT:     Head: Normocephalic.     Nose: Nose normal.     Mouth/Throat:     Mouth: Mucous membranes are moist.  Eyes:     Pupils: Pupils are equal, round, and reactive to light.  Cardiovascular:     Rate and Rhythm: Normal rate and regular rhythm.  Pulmonary:     Effort: Pulmonary effort is normal.     Breath sounds: Normal breath sounds.  Abdominal:     General: Bowel sounds are normal.     Palpations: Abdomen is soft.  Musculoskeletal:        General: Normal range of motion.     Cervical back: Normal range of  motion and neck supple.  Skin:    General: Skin is warm and dry.  Neurological:     Mental Status: She is alert and oriented to person, place, and time.  Psychiatric:        Mood and Affect: Mood normal.        Behavior: Behavior normal.         Assessment & Plan:   Problem List Items Addressed This Visit       Endocrine   Primary hypothyroidism   Relevant Orders   Thyroid Profile     Other   Iron deficiency anemia due to chronic blood loss   Pain - Primary   Relevant Orders   DG Cervical Spine Complete   DG Lumbar Spine Complete   DG KNEE 3 VIEW RIGHT   DG KNEE 3 VIEW LEFT   Other fatigue   Relevant Orders   Vitamin B12   CMP14+EGFR   CBC    Return in about 3 weeks (around 02/23/2023).   Orson Eva, NP

## 2023-02-03 ENCOUNTER — Encounter: Payer: Self-pay | Admitting: Nurse Practitioner

## 2023-02-03 LAB — CBC
Hematocrit: 40.6 % (ref 34.0–46.6)
Hemoglobin: 14 g/dL (ref 11.1–15.9)
MCH: 30.8 pg (ref 26.6–33.0)
MCHC: 34.5 g/dL (ref 31.5–35.7)
MCV: 89 fL (ref 79–97)
Platelets: 361 10*3/uL (ref 150–450)
RBC: 4.54 x10E6/uL (ref 3.77–5.28)
RDW: 12.6 % (ref 11.7–15.4)
WBC: 7.2 10*3/uL (ref 3.4–10.8)

## 2023-02-03 LAB — CMP14+EGFR
ALT: 13 IU/L (ref 0–32)
AST: 10 IU/L (ref 0–40)
Albumin/Globulin Ratio: 1.4 (ref 1.2–2.2)
Albumin: 4.5 g/dL (ref 3.8–4.9)
Alkaline Phosphatase: 93 IU/L (ref 44–121)
BUN/Creatinine Ratio: 11 (ref 9–23)
BUN: 12 mg/dL (ref 6–24)
Bilirubin Total: 1.2 mg/dL (ref 0.0–1.2)
CO2: 20 mmol/L (ref 20–29)
Calcium: 9.6 mg/dL (ref 8.7–10.2)
Chloride: 104 mmol/L (ref 96–106)
Creatinine, Ser: 1.1 mg/dL — ABNORMAL HIGH (ref 0.57–1.00)
Globulin, Total: 3.2 g/dL (ref 1.5–4.5)
Glucose: 80 mg/dL (ref 70–99)
Potassium: 4.4 mmol/L (ref 3.5–5.2)
Sodium: 141 mmol/L (ref 134–144)
Total Protein: 7.7 g/dL (ref 6.0–8.5)
eGFR: 60 mL/min/{1.73_m2} (ref >59)

## 2023-02-03 LAB — THYROID PANEL
Free Thyroxine Index: 3 (ref 1.2–4.9)
T3 Uptake Ratio: 30 % (ref 24–39)
T4, Total: 9.9 ug/dL (ref 4.5–12.0)

## 2023-02-03 LAB — VITAMIN B12: Vitamin B-12: 261 pg/mL (ref 232–1245)

## 2023-02-09 ENCOUNTER — Ambulatory Visit (INDEPENDENT_AMBULATORY_CARE_PROVIDER_SITE_OTHER): Payer: 59

## 2023-02-09 ENCOUNTER — Ambulatory Visit: Payer: 59

## 2023-02-09 DIAGNOSIS — M5440 Lumbago with sciatica, unspecified side: Secondary | ICD-10-CM

## 2023-02-09 DIAGNOSIS — R52 Pain, unspecified: Secondary | ICD-10-CM

## 2023-02-09 DIAGNOSIS — M25561 Pain in right knee: Secondary | ICD-10-CM | POA: Diagnosis not present

## 2023-02-09 DIAGNOSIS — M25562 Pain in left knee: Secondary | ICD-10-CM

## 2023-02-24 ENCOUNTER — Ambulatory Visit: Payer: 59 | Admitting: Nurse Practitioner

## 2023-02-27 ENCOUNTER — Ambulatory Visit: Payer: 59 | Admitting: Nurse Practitioner

## 2023-03-07 ENCOUNTER — Ambulatory Visit: Payer: 59 | Admitting: Nurse Practitioner

## 2023-03-13 ENCOUNTER — Ambulatory Visit: Payer: 59 | Admitting: Nurse Practitioner

## 2023-03-20 ENCOUNTER — Ambulatory Visit: Payer: 59 | Admitting: Nurse Practitioner

## 2023-03-23 ENCOUNTER — Ambulatory Visit: Payer: 59 | Admitting: Nurse Practitioner

## 2023-04-03 ENCOUNTER — Ambulatory Visit: Payer: 59 | Admitting: Nurse Practitioner

## 2023-04-05 ENCOUNTER — Ambulatory Visit (INDEPENDENT_AMBULATORY_CARE_PROVIDER_SITE_OTHER): Payer: 59 | Admitting: Family

## 2023-04-05 ENCOUNTER — Encounter: Payer: Self-pay | Admitting: Family

## 2023-04-05 VITALS — BP 105/65 | HR 87 | Ht 66.0 in | Wt 206.4 lb

## 2023-04-05 DIAGNOSIS — R7303 Prediabetes: Secondary | ICD-10-CM

## 2023-04-05 DIAGNOSIS — M545 Low back pain, unspecified: Secondary | ICD-10-CM

## 2023-04-05 DIAGNOSIS — M25562 Pain in left knee: Secondary | ICD-10-CM | POA: Diagnosis not present

## 2023-04-05 DIAGNOSIS — M25561 Pain in right knee: Secondary | ICD-10-CM

## 2023-04-05 DIAGNOSIS — R5383 Other fatigue: Secondary | ICD-10-CM | POA: Diagnosis not present

## 2023-04-05 DIAGNOSIS — E559 Vitamin D deficiency, unspecified: Secondary | ICD-10-CM | POA: Diagnosis not present

## 2023-04-05 DIAGNOSIS — M542 Cervicalgia: Secondary | ICD-10-CM | POA: Diagnosis not present

## 2023-04-05 DIAGNOSIS — E538 Deficiency of other specified B group vitamins: Secondary | ICD-10-CM | POA: Diagnosis not present

## 2023-04-05 DIAGNOSIS — G8929 Other chronic pain: Secondary | ICD-10-CM

## 2023-04-07 LAB — IRON,TIBC AND FERRITIN PANEL
Ferritin: 64 ng/mL (ref 15–150)
Iron Saturation: 32 % (ref 15–55)
Iron: 103 ug/dL (ref 27–159)
Total Iron Binding Capacity: 318 ug/dL (ref 250–450)
UIBC: 215 ug/dL (ref 131–425)

## 2023-04-07 LAB — VITAMIN B12: Vitamin B-12: 308 pg/mL (ref 232–1245)

## 2023-04-07 LAB — VITAMIN D 25 HYDROXY (VIT D DEFICIENCY, FRACTURES): Vit D, 25-Hydroxy: 33.1 ng/mL (ref 30.0–100.0)

## 2023-04-07 LAB — HEMOGLOBIN A1C
Est. average glucose Bld gHb Est-mCnc: 111 mg/dL
Hgb A1c MFr Bld: 5.5 % (ref 4.8–5.6)

## 2023-04-12 ENCOUNTER — Encounter: Payer: Self-pay | Admitting: Family

## 2023-04-12 ENCOUNTER — Ambulatory Visit (INDEPENDENT_AMBULATORY_CARE_PROVIDER_SITE_OTHER): Payer: 59 | Admitting: Family

## 2023-04-12 VITALS — BP 119/79 | HR 83 | Ht 66.0 in | Wt 208.6 lb

## 2023-04-12 DIAGNOSIS — M545 Low back pain, unspecified: Secondary | ICD-10-CM | POA: Diagnosis not present

## 2023-04-12 DIAGNOSIS — M25562 Pain in left knee: Secondary | ICD-10-CM

## 2023-04-12 DIAGNOSIS — M25561 Pain in right knee: Secondary | ICD-10-CM

## 2023-04-12 DIAGNOSIS — R600 Localized edema: Secondary | ICD-10-CM | POA: Diagnosis not present

## 2023-04-12 DIAGNOSIS — M542 Cervicalgia: Secondary | ICD-10-CM

## 2023-04-12 DIAGNOSIS — G8929 Other chronic pain: Secondary | ICD-10-CM | POA: Diagnosis not present

## 2023-04-12 MED ORDER — HYDROCHLOROTHIAZIDE 12.5 MG PO CAPS: 12.5000 mg | ORAL_CAPSULE | Freq: Every day | ORAL | 1 refills | Status: DC | PRN

## 2023-04-12 NOTE — Progress Notes (Signed)
Established Patient Office Visit  Subjective:  Patient ID: Jasmin Molina, female    DOB: Feb 24, 1966  Age: 57 y.o. MRN: [id number]  Chief Complaint  Patient presents with   Follow-up    1 week F/U    Patient is here today for her 1 week follow up.  She has been doing well in general, still having significant pain in her low back, knees, and neck.  She has heard from the PT office and is going to see them soon.   The only thing she is concerned about is that she has been having some swelling in her lower extremities.  She says this occurs approximately once per month.   No other concerns at this time.   History reviewed. No pertinent past medical history.  History reviewed. No pertinent surgical history.  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    Social Determinants of Health   Financial Resource Strain: Low Risk  (12/27/2017)   Overall Financial Resource Strain (CARDIA)    Difficulty of Paying Living Expenses: Not hard at all  Food Insecurity: No Food Insecurity (12/27/2017)   Hunger Vital Sign    Worried About Running Out of Food in the Last Year: Never true    Ran Out of Food in the Last Year: Never true  Transportation Needs: No Transportation Needs (12/27/2017)   PRAPARE - Administrator, Civil Service (Medical): No    Lack of Transportation (Non-Medical): No  Physical Activity: Not on file  Stress: Stress Concern Present (12/27/2017)   [employer] of  Occupational Health - Occupational Stress Questionnaire    Feeling of Stress : Rather much  Social Connections: Not on file  Intimate Partner Violence: Not on file    Family History  Problem Relation Age of Onset   Cancer Maternal Uncle        Prostate cancer   Cancer Paternal Uncle        Prostate cancer    Allergies  Allergen Reactions   Codeine     Nausea & vomitting    Review of Systems  Musculoskeletal:  Positive for back pain, joint pain (bilateral knees) and neck pain.  All other systems reviewed and are negative.      Objective:   BP 119/79   Pulse 83   Ht 5\' 6"  (1.676 m)   Wt 208 lb 9.6 oz (94.6 kg)   LMP 03/10/2022   SpO2 98%   BMI 33.67 kg/m   Vitals:   04/12/23 1315  BP: 119/79  Pulse: 83  Height: 5\' 6"  (1.676 m)  Weight: 208 lb 9.6 oz (94.6 kg)  SpO2: 98%  BMI (Calculated): 33.68    Physical Exam Vitals and nursing note reviewed.  Constitutional:      Appearance: Normal appearance. She is normal weight.  HENT:     Head: Normocephalic.  Eyes:  Conjunctiva/sclera: Conjunctivae normal.     Pupils: Pupils are equal, round, and reactive to light.  Cardiovascular:     Rate and Rhythm: Normal rate.  Pulmonary:     Effort: Pulmonary effort is normal.  Musculoskeletal:     Cervical back: Pain with movement present. Decreased range of motion.     Lumbar back: Tenderness present. Positive right straight leg raise test and positive left straight leg raise test.     Right knee: Swelling present. Decreased range of motion. Tenderness present.     Left knee: Swelling present. Decreased range of motion. Tenderness present.  Neurological:     General: No focal deficit present.     Mental Status: She is alert.  Psychiatric:        Mood and Affect: Mood normal.        Behavior: Behavior normal.        Thought Content: Thought content normal.        Judgment: Judgment normal.      No results found for any visits on 04/12/23.  Recent Results  (from the past 2160 hour(s))  CMP14+EGFR     Status: Abnormal   Collection Time: 02/02/23  2:08 PM  Result Value Ref Range   Glucose 80 70 - 99 mg/dL   BUN 12 6 - 24 mg/dL   Creatinine, Ser 6.04 (H) 0.57 - 1.00 mg/dL   eGFR 60 >54 UJ/WJX/9.14   BUN/Creatinine Ratio 11 9 - 23   Sodium 141 134 - 144 mmol/L   Potassium 4.4 3.5 - 5.2 mmol/L   Chloride 104 96 - 106 mmol/L   CO2 20 20 - 29 mmol/L   Calcium 9.6 8.7 - 10.2 mg/dL   Total Protein 7.7 6.0 - 8.5 g/dL   Albumin 4.5 3.8 - 4.9 g/dL   Globulin, Total 3.2 1.5 - 4.5 g/dL   Albumin/Globulin Ratio 1.4 1.2 - 2.2   Bilirubin Total 1.2 0.0 - 1.2 mg/dL   Alkaline Phosphatase 93 44 - 121 IU/L   AST 10 0 - 40 IU/L   ALT 13 0 - 32 IU/L  CBC     Status: None   Collection Time: 02/02/23  2:08 PM  Result Value Ref Range   WBC 7.2 3.4 - 10.8 x10E3/uL   RBC 4.54 3.77 - 5.28 x10E6/uL   Hemoglobin 14.0 11.1 - 15.9 g/dL   Hematocrit 78.2 95.6 - 46.6 %   MCV 89 79 - 97 fL   MCH 30.8 26.6 - 33.0 pg   MCHC 34.5 31.5 - 35.7 g/dL   RDW 21.3 08.6 - 57.8 %   Platelets 361 150 - 450 x10E3/uL  Thyroid Profile     Status: None   Collection Time: 02/02/23  2:10 PM  Result Value Ref Range   T4, Total 9.9 4.5 - 12.0 ug/dL   T3 Uptake Ratio 30 24 - 39 %   Free Thyroxine Index 3.0 1.2 - 4.9  Vitamin B12     Status: None   Collection Time: 02/02/23  2:10 PM  Result Value Ref Range   Vitamin B-12 261 232 - 1,245 pg/mL  Hemoglobin A1c     Status: None   Collection Time: 04/05/23 12:19 PM  Result Value Ref Range   Hgb A1c MFr Bld 5.5 4.8 - 5.6 %    Comment:          Prediabetes: 5.7 - 6.4          Diabetes: >6.4  Glycemic control for adults with diabetes: <7.0    Est. average glucose Bld gHb Est-mCnc 111 mg/dL  Vitamin Z61     Status: None   Collection Time: 04/05/23 12:19 PM  Result Value Ref Range   Vitamin B-12 308 232 - 1,245 pg/mL  Vitamin D (25 hydroxy)     Status: None   Collection Time: 04/05/23 12:19 PM  Result Value Ref Range    Vit D, 25-Hydroxy 33.1 30.0 - 100.0 ng/mL    Comment: Vitamin D deficiency has been defined by the Institute of Medicine and an Endocrine Society practice guideline as a level of serum 25-OH vitamin D less than 20 ng/mL (1,2). The Endocrine Society went on to further define vitamin D insufficiency as a level between 21 and 29 ng/mL (2). 1. IOM (Institute of Medicine). 2010. Dietary reference    intakes for calcium and D. [place]: The    [employer]. 2. Holick MF, Binkley [place], Bischoff-Ferrari HA, et al.    Evaluation, treatment, and prevention of vitamin D    deficiency: an Endocrine Society clinical practice    guideline. JCEM. 2011 Jul; 96(7):1911-30.   Iron, TIBC and Ferritin Panel     Status: None   Collection Time: 04/05/23 12:19 PM  Result Value Ref Range   Total Iron Binding Capacity 318 250 - 450 ug/dL   UIBC 096 045 - 409 ug/dL   Iron 811 27 - 914 ug/dL   Iron Saturation 32 15 - 55 %   Ferritin 64 15 - 150 ng/mL       Assessment & Plan:   Problem List Items Addressed This Visit   None Visit Diagnoses     Chronic bilateral low back pain, unspecified whether sciatica present    -  Primary   Relevant Orders   MR Lumbar Spine Wo Contrast   Chronic pain of both knees       Relevant Orders   MR Knee Right Wo Contrast   MR Knee Left  Wo Contrast   Neck pain       Relevant Orders   MR Cervical Spine Wo Contrast   Bilateral leg edema       Sending HCTZ for pt.  Will check at follow up to see if this helps with her symptoms.      Will work to get MRI's approved.  Also sending some meds to help with her fluid.  Recheck at follow up.   Return in about 1 month (around 05/13/2023).   Total time spent: 20 minutes  Miki Kins, FNP  04/12/2023   This document may have been prepared by [facility] Voice Recognition software and as such may include unintentional dictation errors.

## 2023-04-15 ENCOUNTER — Encounter: Payer: Self-pay | Admitting: Family

## 2023-04-22 ENCOUNTER — Encounter: Payer: Self-pay | Admitting: Family

## 2023-04-22 NOTE — Progress Notes (Signed)
Established Patient Office Visit  Subjective:  Patient ID: Jasmin Molina, female    DOB: 11-06-1965  Age: 57 y.o. MRN: [id number]  Chief Complaint  Patient presents with   Follow-up    3 week F/U    Patient here today for follow up after her new patient appointment.  She has been feeling very tired since her previous appointment, and she has been having significant low back pain.   She did not have several of her other labs checked when she was here originally, so we will check these today.     No other concerns at this time.   History reviewed. No pertinent past medical history.  History reviewed. No pertinent surgical history.  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    Social Determinants of Health   Financial Resource Strain: Low Risk  (12/27/2017)   Overall Financial Resource Strain (CARDIA)    Difficulty of Paying Living Expenses: Not hard at all  Food Insecurity: No Food Insecurity (12/27/2017)   Hunger Vital Sign    Worried About Running Out of Food in the Last Year: Never true    Ran Out of Food in the Last Year: Never true  Transportation Needs: No Transportation Needs (12/27/2017)   PRAPARE - Administrator, Civil Service (Medical): No    Lack of Transportation (Non-Medical): No  Physical Activity: Not on file  Stress: Stress Concern Present (12/27/2017)   [employer] of Occupational Health - Occupational Stress Questionnaire    Feeling of Stress :  Rather much  Social Connections: Not on file  Intimate Partner Violence: Not on file    Family History  Problem Relation Age of Onset   Cancer Maternal Uncle        Prostate cancer   Cancer Paternal Uncle        Prostate cancer    Allergies  Allergen Reactions   Codeine     Nausea & vomitting    Review of Systems  Constitutional:  Positive for malaise/fatigue.  Musculoskeletal:  Positive for back pain.  All other systems reviewed and are negative.      Objective:   BP 105/65   Pulse 87   Ht 5\' 6"  (1.676 m)   Wt 206 lb 6.4 oz (93.6 kg)   LMP 03/10/2022   SpO2 96%   BMI 33.31 kg/m   Vitals:   04/05/23 1154  BP: 105/65  Pulse: 87  Height: 5\' 6"  (1.676 m)  Weight: 206 lb 6.4 oz (93.6 kg)  SpO2: 96%  BMI (Calculated): 33.33    Physical Exam Vitals and nursing note reviewed.  Constitutional:      Appearance: Normal appearance. She is normal weight.  HENT:     Head: Normocephalic.  Eyes:     Pupils: Pupils are equal, round, and reactive to light.  Cardiovascular:     Rate and Rhythm:  Normal rate.  Pulmonary:     Effort: Pulmonary effort is normal.  Neurological:     General: No focal deficit present.     Mental Status: She is alert and oriented to person, place, and time. Mental status is at baseline.  Psychiatric:        Mood and Affect: Mood normal.        Thought Content: Thought content normal.        Judgment: Judgment normal.      Results for orders placed or performed in visit on 04/05/23  Hemoglobin A1c  Result Value Ref Range   Hgb A1c MFr Bld 5.5 4.8 - 5.6 %   Est. average glucose Bld gHb Est-mCnc 111 mg/dL  Vitamin Z61  Result Value Ref Range   Vitamin B-12 308 232 - 1,245 pg/mL  Vitamin D (25 hydroxy)  Result Value Ref Range   Vit D, 25-Hydroxy 33.1 30.0 - 100.0 ng/mL  Iron, TIBC and Ferritin Panel  Result Value Ref Range   Total Iron Binding Capacity 318 250 - 450 ug/dL   UIBC 096 045 - 409 ug/dL   Iron 811 27 - 914 ug/dL    Iron Saturation 32 15 - 55 %   Ferritin 64 15 - 150 ng/mL    Recent Results (from the past 2160 hour(s))  CMP14+EGFR     Status: Abnormal   Collection Time: 02/02/23  2:08 PM  Result Value Ref Range   Glucose 80 70 - 99 mg/dL   BUN 12 6 - 24 mg/dL   Creatinine, Ser 7.82 (H) 0.57 - 1.00 mg/dL   eGFR 60 >95 AO/ZHY/8.65   BUN/Creatinine Ratio 11 9 - 23   Sodium 141 134 - 144 mmol/L   Potassium 4.4 3.5 - 5.2 mmol/L   Chloride 104 96 - 106 mmol/L   CO2 20 20 - 29 mmol/L   Calcium 9.6 8.7 - 10.2 mg/dL   Total Protein 7.7 6.0 - 8.5 g/dL   Albumin 4.5 3.8 - 4.9 g/dL   Globulin, Total 3.2 1.5 - 4.5 g/dL   Albumin/Globulin Ratio 1.4 1.2 - 2.2   Bilirubin Total 1.2 0.0 - 1.2 mg/dL   Alkaline Phosphatase 93 44 - 121 IU/L   AST 10 0 - 40 IU/L   ALT 13 0 - 32 IU/L  CBC     Status: None   Collection Time: 02/02/23  2:08 PM  Result Value Ref Range   WBC 7.2 3.4 - 10.8 x10E3/uL   RBC 4.54 3.77 - 5.28 x10E6/uL   Hemoglobin 14.0 11.1 - 15.9 g/dL   Hematocrit 78.4 69.6 - 46.6 %   MCV 89 79 - 97 fL   MCH 30.8 26.6 - 33.0 pg   MCHC 34.5 31.5 - 35.7 g/dL   RDW 29.5 28.4 - 13.2 %   Platelets 361 150 - 450 x10E3/uL  Thyroid Profile     Status: None   Collection Time: 02/02/23  2:10 PM  Result Value Ref Range   T4, Total 9.9 4.5 - 12.0 ug/dL   T3 Uptake Ratio 30 24 - 39 %   Free Thyroxine Index 3.0 1.2 - 4.9  Vitamin B12     Status: None   Collection Time: 02/02/23  2:10 PM  Result Value Ref Range   Vitamin B-12 261 232 - 1,245 pg/mL  Hemoglobin A1c     Status: None   Collection Time: 04/05/23 12:19 PM  Result Value Ref Range   Hgb A1c MFr Bld 5.5 4.8 -  5.6 %    Comment:          Prediabetes: 5.7 - 6.4          Diabetes: >6.4          Glycemic control for adults with diabetes: <7.0    Est. average glucose Bld gHb Est-mCnc 111 mg/dL  Vitamin Z61     Status: None   Collection Time: 04/05/23 12:19 PM  Result Value Ref Range   Vitamin B-12 308 232 - 1,245 pg/mL  Vitamin D (25 hydroxy)      Status: None   Collection Time: 04/05/23 12:19 PM  Result Value Ref Range   Vit D, 25-Hydroxy 33.1 30.0 - 100.0 ng/mL    Comment: Vitamin D deficiency has been defined by the Institute of Medicine and an Endocrine Society practice guideline as a level of serum 25-OH vitamin D less than 20 ng/mL (1,2). The Endocrine Society went on to further define vitamin D insufficiency as a level between 21 and 29 ng/mL (2). 1. IOM (Institute of Medicine). 2010. Dietary reference    intakes for calcium and D. [place]: The    [employer]. 2. Holick MF, Binkley [place], Bischoff-Ferrari HA, et al.    Evaluation, treatment, and prevention of vitamin D    deficiency: an Endocrine Society clinical practice    guideline. JCEM. 2011 Jul; 96(7):1911-30.   Iron, TIBC and Ferritin Panel     Status: None   Collection Time: 04/05/23 12:19 PM  Result Value Ref Range   Total Iron Binding Capacity 318 250 - 450 ug/dL   UIBC 096 045 - 409 ug/dL   Iron 811 27 - 914 ug/dL   Iron Saturation 32 15 - 55 %   Ferritin 64 15 - 150 ng/mL       Assessment & Plan:   Problem List Items Addressed This Visit       Active Problems   Other fatigue   Relevant Orders   Iron, TIBC and Ferritin Panel (Completed)   Other Visit Diagnoses     Chronic bilateral low back pain, unspecified whether sciatica present    -  Primary   x-rays were negative for any acute abnormalities.  Will send for physical therapy.   Relevant Orders   Ambulatory referral to Physical Therapy   Chronic pain of both knees       Patient has had significant issues with this pain, in addition to her back.  Will send order for physical therapy.   Relevant Orders   Ambulatory referral to Physical Therapy   Neck pain       Sending order for physical therapy.  Xray of neck was negative.   Relevant Orders   Ambulatory referral to Physical Therapy   Prediabetes       Relevant Orders   Hemoglobin A1c (Completed)   Vitamin D  deficiency, unspecified       Relevant Orders   Vitamin D (25 hydroxy) (Completed)   B12 deficiency due to diet       Relevant Orders   Vitamin B12 (Completed)       Return in about 1 week (around 04/12/2023) for F/U.   Total time spent: 20 minutes  Miki Kins, FNP  04/05/2023   This document may have been prepared by [facility] Voice Recognition software and as such may include unintentional dictation errors.

## 2023-04-24 DIAGNOSIS — M5451 Vertebrogenic low back pain: Secondary | ICD-10-CM | POA: Diagnosis not present

## 2023-04-24 DIAGNOSIS — M6281 Muscle weakness (generalized): Secondary | ICD-10-CM | POA: Diagnosis not present

## 2023-04-24 DIAGNOSIS — M25561 Pain in right knee: Secondary | ICD-10-CM | POA: Diagnosis not present

## 2023-04-24 DIAGNOSIS — M542 Cervicalgia: Secondary | ICD-10-CM | POA: Diagnosis not present

## 2023-04-24 DIAGNOSIS — M25562 Pain in left knee: Secondary | ICD-10-CM | POA: Diagnosis not present

## 2023-05-17 ENCOUNTER — Ambulatory Visit: Payer: 59 | Admitting: Family

## 2023-05-31 ENCOUNTER — Inpatient Hospital Stay: Payer: 59

## 2023-05-31 ENCOUNTER — Inpatient Hospital Stay: Payer: 59 | Admitting: Internal Medicine

## 2023-06-06 ENCOUNTER — Other Ambulatory Visit: Payer: Self-pay | Admitting: Family

## 2023-06-07 ENCOUNTER — Ambulatory Visit (INDEPENDENT_AMBULATORY_CARE_PROVIDER_SITE_OTHER): Payer: 59 | Admitting: Family

## 2023-06-07 ENCOUNTER — Encounter: Payer: Self-pay | Admitting: Family

## 2023-06-07 VITALS — BP 108/84 | HR 72 | Ht 66.0 in | Wt 207.4 lb

## 2023-06-07 DIAGNOSIS — I10 Essential (primary) hypertension: Secondary | ICD-10-CM

## 2023-06-07 DIAGNOSIS — G4733 Obstructive sleep apnea (adult) (pediatric): Secondary | ICD-10-CM | POA: Diagnosis not present

## 2023-06-07 DIAGNOSIS — R5383 Other fatigue: Secondary | ICD-10-CM

## 2023-06-07 DIAGNOSIS — E782 Mixed hyperlipidemia: Secondary | ICD-10-CM

## 2023-06-07 DIAGNOSIS — D500 Iron deficiency anemia secondary to blood loss (chronic): Secondary | ICD-10-CM | POA: Diagnosis not present

## 2023-06-07 DIAGNOSIS — E039 Hypothyroidism, unspecified: Secondary | ICD-10-CM

## 2023-06-07 DIAGNOSIS — R7303 Prediabetes: Secondary | ICD-10-CM | POA: Diagnosis not present

## 2023-06-07 DIAGNOSIS — E559 Vitamin D deficiency, unspecified: Secondary | ICD-10-CM | POA: Diagnosis not present

## 2023-06-07 DIAGNOSIS — E538 Deficiency of other specified B group vitamins: Secondary | ICD-10-CM

## 2023-06-07 MED ORDER — CYANOCOBALAMIN 1000 MCG/ML IJ SOLN
1000.0000 ug | Freq: Once | INTRAMUSCULAR | Status: AC
Start: 2023-06-07 — End: 2023-06-07
  Administered 2023-06-07: 1000 ug via INTRAMUSCULAR

## 2023-06-07 MED ORDER — CYANOCOBALAMIN 1000 MCG/ML IJ SOLN: 1000.0000 ug | INTRAMUSCULAR | 3 refills | Status: AC

## 2023-06-07 MED ORDER — CELECOXIB 100 MG PO CAPS: 100.0000 mg | ORAL_CAPSULE | Freq: Two times a day (BID) | ORAL | 1 refills | Status: DC

## 2023-06-08 LAB — CMP14+EGFR
ALT: 15 IU/L (ref 0–32)
AST: 11 IU/L (ref 0–40)
Albumin: 4.3 g/dL (ref 3.8–4.9)
Alkaline Phosphatase: 130 IU/L — ABNORMAL HIGH (ref 44–121)
BUN/Creatinine Ratio: 15 (ref 9–23)
BUN: 17 mg/dL (ref 6–24)
Bilirubin Total: 0.8 mg/dL (ref 0.0–1.2)
CO2: 23 mmol/L (ref 20–29)
Calcium: 9.7 mg/dL (ref 8.7–10.2)
Chloride: 103 mmol/L (ref 96–106)
Creatinine, Ser: 1.13 mg/dL — ABNORMAL HIGH (ref 0.57–1.00)
Globulin, Total: 3.1 g/dL (ref 1.5–4.5)
Glucose: 73 mg/dL (ref 70–99)
Potassium: 4.6 mmol/L (ref 3.5–5.2)
Sodium: 141 mmol/L (ref 134–144)
Total Protein: 7.4 g/dL (ref 6.0–8.5)
eGFR: 57 mL/min/{1.73_m2} — ABNORMAL LOW (ref >59)

## 2023-06-08 LAB — IRON,TIBC AND FERRITIN PANEL
Ferritin: 54 ng/mL (ref 15–150)
Iron Saturation: 37 % (ref 15–55)
Iron: 102 ug/dL (ref 27–159)
Total Iron Binding Capacity: 276 ug/dL (ref 250–450)
UIBC: 174 ug/dL (ref 131–425)

## 2023-06-08 LAB — LIPID PANEL
Chol/HDL Ratio: 2.6 ratio (ref 0.0–4.4)
Cholesterol, Total: 205 mg/dL — ABNORMAL HIGH (ref 100–199)
HDL: 80 mg/dL (ref >39)
LDL Chol Calc (NIH): 110 mg/dL — ABNORMAL HIGH (ref 0–99)
Triglycerides: 82 mg/dL (ref 0–149)
VLDL Cholesterol Cal: 15 mg/dL (ref 5–40)

## 2023-06-08 LAB — TSH+T4F+T3FREE
Free T4: 1.17 ng/dL (ref 0.82–1.77)
T3, Free: 2.6 pg/mL (ref 2.0–4.4)
TSH: 7.85 u[IU]/mL — ABNORMAL HIGH (ref 0.450–4.500)

## 2023-06-08 LAB — VITAMIN B12: Vitamin B-12: 253 pg/mL (ref 232–1245)

## 2023-06-08 LAB — HEMOGLOBIN A1C
Est. average glucose Bld gHb Est-mCnc: 111 mg/dL
Hgb A1c MFr Bld: 5.5 % (ref 4.8–5.6)

## 2023-06-08 LAB — VITAMIN D 25 HYDROXY (VIT D DEFICIENCY, FRACTURES): Vit D, 25-Hydroxy: 33.6 ng/mL (ref 30.0–100.0)

## 2023-06-14 ENCOUNTER — Telehealth: Payer: Self-pay | Admitting: Nurse Practitioner

## 2023-06-14 DIAGNOSIS — M5451 Vertebrogenic low back pain: Secondary | ICD-10-CM | POA: Diagnosis not present

## 2023-06-14 NOTE — Telephone Encounter (Signed)
Patient left VM wanting to know if we got meds/pens in? I am not sure what medication she is referring to. Please advise.

## 2023-06-15 NOTE — Telephone Encounter (Signed)
Pt was contacted and notified that she has an ozempic pen sample available at the office for pick up. Pt verbalized understanding and will stop by during normal business hours to pick up.

## 2023-06-24 ENCOUNTER — Encounter: Payer: Self-pay | Admitting: Family

## 2023-06-24 NOTE — Progress Notes (Signed)
Established Patient Office Visit  Subjective:  Patient ID: Jasmin Molina, female    DOB: 02-16-1966  Age: 57 y.o. MRN: [id number]  Chief Complaint  Patient presents with   Follow-up    1 month follow up    Patient is here today for her 1 month follow up.  She has been feeling fairly well since last appointment.   She does have additional concerns to discuss today.  We were unable to get the wegovy approved for her.  Asks if there are any other options we can try.   Labs are due today. She needs refills.   I have reviewed her active problem list, medication list, allergies, notes from last encounter, lab results for her appointment today.     No other concerns at this time.   History reviewed. No pertinent past medical history.  History reviewed. No pertinent surgical history.  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    Social Determinants of Health   Financial Resource Strain: Low Risk  (12/27/2017)   Overall Financial Resource Strain (CARDIA)    Difficulty of Paying Living Expenses: Not hard at all  Food Insecurity: No Food Insecurity (12/27/2017)   Hunger Vital Sign    Worried About Running Out of Food in the Last Year: Never true    Ran Out of Food in the Last Year: Never true  Transportation Needs: No Transportation Needs (12/27/2017)   PRAPARE - Administrator, Civil Service (Medical): No    Lack of Transportation (Non-Medical): No  Physical Activity: Not on  file  Stress: Stress Concern Present (12/27/2017)   [employer] of Occupational Health - Occupational Stress Questionnaire    Feeling of Stress : Rather much  Social Connections: Not on file  Intimate Partner Violence: Not on file    Family History  Problem Relation Age of Onset   Cancer Maternal Uncle        Prostate cancer   Cancer Paternal Uncle        Prostate cancer    Allergies  Allergen Reactions   Codeine     Nausea & vomitting    ROS     Objective:   BP 108/84   Pulse 72   Ht 5\' 6"  (1.676 m)   Wt 207 lb 6.4 oz (94.1 kg)   LMP 03/10/2022   SpO2 98%   BMI 33.48 kg/m   Vitals:   06/07/23 1119  BP: 108/84  Pulse: 72  Height: 5\' 6"  (1.676 m)  Weight: 207 lb 6.4 oz (94.1 kg)  SpO2: 98%  BMI (Calculated): 33.49    Physical Exam   Results for orders placed or performed in visit on 06/07/23  Lipid panel  Result Value Ref Range   Cholesterol, Total 205 (H) 100 - 199 mg/dL   Triglycerides 82 0 - 149 mg/dL   HDL 80 >56 mg/dL  VLDL Cholesterol Cal 15 5 - 40 mg/dL   LDL Chol Calc (NIH) 409 (H) 0 - 99 mg/dL   Chol/HDL Ratio 2.6 0.0 - 4.4 ratio  VITAMIN D 25 Hydroxy (Vit-D Deficiency, Fractures)  Result Value Ref Range   Vit D, 25-Hydroxy 33.6 30.0 - 100.0 ng/mL  CMP14+EGFR  Result Value Ref Range   Glucose 73 70 - 99 mg/dL   BUN 17 6 - 24 mg/dL   Creatinine, Ser 8.11 (H) 0.57 - 1.00 mg/dL   eGFR 57 (L) >91 YN/WGN/5.62   BUN/Creatinine Ratio 15 9 - 23   Sodium 141 134 - 144 mmol/L   Potassium 4.6 3.5 - 5.2 mmol/L   Chloride 103 96 - 106 mmol/L   CO2 23 20 - 29 mmol/L   Calcium 9.7 8.7 - 10.2 mg/dL   Total Protein 7.4 6.0 - 8.5 g/dL   Albumin 4.3 3.8 - 4.9 g/dL   Globulin, Total 3.1 1.5 - 4.5 g/dL   Bilirubin Total 0.8 0.0 - 1.2 mg/dL   Alkaline Phosphatase 130 (H) 44 - 121 IU/L   AST 11 0 - 40 IU/L   ALT 15 0 - 32 IU/L  Hemoglobin A1c  Result Value Ref Range   Hgb A1c MFr Bld 5.5 4.8 - 5.6 %   Est. average glucose Bld gHb Est-mCnc 111  mg/dL  Vitamin Z30  Result Value Ref Range   Vitamin B-12 253 232 - 1,245 pg/mL  TSH+T4F+T3Free  Result Value Ref Range   TSH 7.850 (H) 0.450 - 4.500 uIU/mL   T3, Free 2.6 2.0 - 4.4 pg/mL   Free T4 1.17 0.82 - 1.77 ng/dL  Iron, TIBC and Ferritin Panel  Result Value Ref Range   Total Iron Binding Capacity 276 250 - 450 ug/dL   UIBC 865 784 - 696 ug/dL   Iron 295 27 - 284 ug/dL   Iron Saturation 37 15 - 55 %   Ferritin 54 15 - 150 ng/mL    Recent Results (from the past 2160 hour(s))  Hemoglobin A1c     Status: None   Collection Time: 04/05/23 12:19 PM  Result Value Ref Range   Hgb A1c MFr Bld 5.5 4.8 - 5.6 %    Comment:          Prediabetes: 5.7 - 6.4          Diabetes: >6.4          Glycemic control for adults with diabetes: <7.0    Est. average glucose Bld gHb Est-mCnc 111 mg/dL  Vitamin X32     Status: None   Collection Time: 04/05/23 12:19 PM  Result Value Ref Range   Vitamin B-12 308 232 - 1,245 pg/mL  Vitamin D (25 hydroxy)     Status: None   Collection Time: 04/05/23 12:19 PM  Result Value Ref Range   Vit D, 25-Hydroxy 33.1 30.0 - 100.0 ng/mL    Comment: Vitamin D deficiency has been defined by the Institute of Medicine and an Endocrine Society practice guideline as a level of serum 25-OH vitamin D less than 20 ng/mL (1,2). The Endocrine Society went on to further define vitamin D insufficiency as a level between 21 and 29 ng/mL (2). 1. IOM (Institute of Medicine). 2010. Dietary reference    intakes for calcium and D. [place]: The    [employer]. 2. Holick MF, Binkley Cheney, Bischoff-Ferrari HA, et al.    Evaluation, treatment, and prevention of vitamin D    deficiency: an  Endocrine Society clinical practice    guideline. JCEM. 2011 Jul; 96(7):1911-30.   Iron, TIBC and Ferritin Panel     Status: None   Collection Time: 04/05/23 12:19 PM  Result Value Ref Range   Total Iron Binding Capacity 318 250 - 450 ug/dL   UIBC 409 811 - 914 ug/dL   Iron  782 27 - 956 ug/dL   Iron Saturation 32 15 - 55 %   Ferritin 64 15 - 150 ng/mL  Lipid panel     Status: Abnormal   Collection Time: 06/07/23 11:59 AM  Result Value Ref Range   Cholesterol, Total 205 (H) 100 - 199 mg/dL   Triglycerides 82 0 - 149 mg/dL   HDL 80 >21 mg/dL   VLDL Cholesterol Cal 15 5 - 40 mg/dL   LDL Chol Calc (NIH) 308 (H) 0 - 99 mg/dL   Chol/HDL Ratio 2.6 0.0 - 4.4 ratio    Comment:                                   T. Chol/HDL Ratio                                             Men  Women                               1/2 Avg.Risk  3.4    3.3                                   Avg.Risk  5.0    4.4                                2X Avg.Risk  9.6    7.1                                3X Avg.Risk 23.4   11.0   VITAMIN D 25 Hydroxy (Vit-D Deficiency, Fractures)     Status: None   Collection Time: 06/07/23 11:59 AM  Result Value Ref Range   Vit D, 25-Hydroxy 33.6 30.0 - 100.0 ng/mL    Comment: Vitamin D deficiency has been defined by the Institute of Medicine and an Endocrine Society practice guideline as a level of serum 25-OH vitamin D less than 20 ng/mL (1,2). The Endocrine Society went on to further define vitamin D insufficiency as a level between 21 and 29 ng/mL (2). 1. IOM (Institute of Medicine). 2010. Dietary reference    intakes for calcium and D. [place]: The    [employer]. 2. Holick MF, Binkley Braggs, Bischoff-Ferrari HA, et al.    Evaluation, treatment, and prevention of vitamin D    deficiency: an Endocrine Society clinical practice    guideline. JCEM. 2011 Jul; 96(7):1911-30.   CMP14+EGFR     Status: Abnormal   Collection Time: 06/07/23 11:59 AM  Result Value Ref Range   Glucose 73 70 - 99 mg/dL   BUN 17 6 - 24 mg/dL   Creatinine, Ser 6.57 (H) 0.57 - 1.00 mg/dL  eGFR 57 (L) >59 mL/min/1.73   BUN/Creatinine Ratio 15 9 - 23   Sodium 141 134 - 144 mmol/L   Potassium 4.6 3.5 - 5.2 mmol/L   Chloride 103 96 - 106 mmol/L   CO2 23 20 -  29 mmol/L   Calcium 9.7 8.7 - 10.2 mg/dL   Total Protein 7.4 6.0 - 8.5 g/dL   Albumin 4.3 3.8 - 4.9 g/dL   Globulin, Total 3.1 1.5 - 4.5 g/dL   Bilirubin Total 0.8 0.0 - 1.2 mg/dL   Alkaline Phosphatase 130 (H) 44 - 121 IU/L   AST 11 0 - 40 IU/L   ALT 15 0 - 32 IU/L  Hemoglobin A1c     Status: None   Collection Time: 06/07/23 11:59 AM  Result Value Ref Range   Hgb A1c MFr Bld 5.5 4.8 - 5.6 %    Comment:          Prediabetes: 5.7 - 6.4          Diabetes: >6.4          Glycemic control for adults with diabetes: <7.0    Est. average glucose Bld gHb Est-mCnc 111 mg/dL  Vitamin N56     Status: None   Collection Time: 06/07/23 11:59 AM  Result Value Ref Range   Vitamin B-12 253 232 - 1,245 pg/mL  TSH+T4F+T3Free     Status: Abnormal   Collection Time: 06/07/23 11:59 AM  Result Value Ref Range   TSH 7.850 (H) 0.450 - 4.500 uIU/mL   T3, Free 2.6 2.0 - 4.4 pg/mL   Free T4 1.17 0.82 - 1.77 ng/dL  Iron, TIBC and Ferritin Panel     Status: None   Collection Time: 06/07/23 11:59 AM  Result Value Ref Range   Total Iron Binding Capacity 276 250 - 450 ug/dL   UIBC 213 086 - 578 ug/dL   Iron 469 27 - 629 ug/dL   Iron Saturation 37 15 - 55 %   Ferritin 54 15 - 150 ng/mL       Assessment & Plan:   Problem List Items Addressed This Visit       Active Problems   Iron deficiency anemia due to chronic blood loss   Relevant Medications   cyanocobalamin (VITAMIN B12) 1000 MCG/ML injection   Other Relevant Orders   CMP14+EGFR (Completed)   CBC with Differential/Platelet   Iron, TIBC and Ferritin Panel (Completed)   Primary hypothyroidism   Relevant Orders   CMP14+EGFR (Completed)   CBC with Differential/Platelet   TSH+T4F+T3Free (Completed)   Other fatigue   Relevant Orders   CMP14+EGFR (Completed)   CBC with Differential/Platelet   Other Visit Diagnoses     B12 deficiency due to diet    -  Primary   Checking labs today.  Will continue supplements as needed.   Relevant  Medications   cyanocobalamin (VITAMIN B12) injection 1,000 mcg (Completed)   Other Relevant Orders   CMP14+EGFR (Completed)   Vitamin B12 (Completed)   CBC with Differential/Platelet   Essential hypertension, benign       Relevant Orders   CMP14+EGFR (Completed)   CBC with Differential/Platelet   Prediabetes       Checking labs today Patient counseled on dietary choices and verbalized understanding.   Relevant Orders   CMP14+EGFR (Completed)   Hemoglobin A1c (Completed)   CBC with Differential/Platelet   Mixed hyperlipidemia       Checking labs today.  Continue current therapy for lipid control. Will modify  as needed based on labwork results.   Relevant Orders   Lipid panel (Completed)   CMP14+EGFR (Completed)   CBC with Differential/Platelet   Vitamin D deficiency, unspecified       Checking labs today.  Will continue supplements as needed.   Relevant Orders   VITAMIN D 25 Hydroxy (Vit-D Deficiency, Fractures) (Completed)   CMP14+EGFR (Completed)   CBC with Differential/Platelet   Obstructive sleep apnea syndrome       Referral sent to sleep studies.  Will await results for next steps.   Relevant Orders   CMP14+EGFR (Completed)   CBC with Differential/Platelet   Ambulatory referral to Sleep Studies       Return in about 3 months (around 09/07/2023).   Total time spent: 20 minutes  Miki Kins, FNP  06/07/2023   This document may have been prepared by [facility] Voice Recognition software and as such may include unintentional dictation errors.

## 2023-07-30 ENCOUNTER — Other Ambulatory Visit: Payer: Self-pay | Admitting: Family

## 2023-08-07 ENCOUNTER — Encounter: Payer: Self-pay | Admitting: Nurse Practitioner

## 2023-08-07 ENCOUNTER — Inpatient Hospital Stay: Payer: 59

## 2023-08-07 ENCOUNTER — Inpatient Hospital Stay (HOSPITAL_BASED_OUTPATIENT_CLINIC_OR_DEPARTMENT_OTHER): Payer: 59 | Admitting: Nurse Practitioner

## 2023-08-07 ENCOUNTER — Inpatient Hospital Stay: Payer: 59 | Attending: Internal Medicine

## 2023-08-07 VITALS — BP 108/81 | HR 68 | Temp 98.6°F | Resp 20 | Wt 204.6 lb

## 2023-08-07 DIAGNOSIS — D509 Iron deficiency anemia, unspecified: Secondary | ICD-10-CM

## 2023-08-07 DIAGNOSIS — E559 Vitamin D deficiency, unspecified: Secondary | ICD-10-CM | POA: Insufficient documentation

## 2023-08-07 DIAGNOSIS — E538 Deficiency of other specified B group vitamins: Secondary | ICD-10-CM

## 2023-08-07 DIAGNOSIS — D500 Iron deficiency anemia secondary to blood loss (chronic): Secondary | ICD-10-CM

## 2023-08-07 LAB — CBC WITH DIFFERENTIAL (CANCER CENTER ONLY)
Abs Immature Granulocytes: 0.02 10*3/uL (ref 0.00–0.07)
Basophils Absolute: 0.1 10*3/uL (ref 0.0–0.1)
Basophils Relative: 1 %
Eosinophils Absolute: 0.3 10*3/uL (ref 0.0–0.5)
Eosinophils Relative: 4 %
HCT: 40.9 % (ref 36.0–46.0)
Hemoglobin: 13.6 g/dL (ref 12.0–15.0)
Immature Granulocytes: 0 %
Lymphocytes Relative: 27 %
Lymphs Abs: 1.8 10*3/uL (ref 0.7–4.0)
MCH: 30.6 pg (ref 26.0–34.0)
MCHC: 33.3 g/dL (ref 30.0–36.0)
MCV: 91.9 fL (ref 80.0–100.0)
Monocytes Absolute: 0.5 10*3/uL (ref 0.1–1.0)
Monocytes Relative: 7 %
Neutro Abs: 4.1 10*3/uL (ref 1.7–7.7)
Neutrophils Relative %: 61 %
Platelet Count: 368 10*3/uL (ref 150–400)
RBC: 4.45 MIL/uL (ref 3.87–5.11)
RDW: 12.7 % (ref 11.5–15.5)
WBC Count: 6.6 10*3/uL (ref 4.0–10.5)
nRBC: 0 % (ref 0.0–0.2)

## 2023-08-07 LAB — BASIC METABOLIC PANEL - CANCER CENTER ONLY
Anion gap: 8 (ref 5–15)
BUN: 16 mg/dL (ref 6–20)
CO2: 27 mmol/L (ref 22–32)
Calcium: 8.9 mg/dL (ref 8.9–10.3)
Chloride: 100 mmol/L (ref 98–111)
Creatinine: 1.14 mg/dL — ABNORMAL HIGH (ref 0.44–1.00)
GFR, Estimated: 57 mL/min — ABNORMAL LOW (ref >60)
Glucose, Bld: 89 mg/dL (ref 70–99)
Potassium: 4.1 mmol/L (ref 3.5–5.1)
Sodium: 135 mmol/L (ref 135–145)

## 2023-08-07 LAB — IRON AND TIBC
Iron: 95 ug/dL (ref 28–170)
Saturation Ratios: 32 % — ABNORMAL HIGH (ref 10.4–31.8)
TIBC: 301 ug/dL (ref 250–450)
UIBC: 206 ug/dL

## 2023-08-07 LAB — FERRITIN: Ferritin: 46 ng/mL (ref 11–307)

## 2023-08-07 NOTE — Progress Notes (Signed)
[facility] CONSULT NOTE  Patient Care Team: Miki Kins, FNP as PCP - General (Family Medicine) Earna Coder, MD as Consulting Physician (Oncology)  CHIEF COMPLAINTS/PURPOSE OF CONSULTATION: Iron deficiency anemia  #Hx of IDA [>20 years] Iron deficiency Anemia: [hb8.3/ ferritin- 5 /PCP] s/p IV venofer; improved; CT scan August 2020-fibroid  #August 2020 CT scan-fibroid [previous biopsy with PCP; unsuccessful]; SEP 2020- GI- no show [no EGD/colo]  Oncology History   No history exists.    HISTORY OF PRESENTING ILLNESS: Alone ambulating dependently. Jasmin Molina 57 y.o. female with a history of iron deficient anemia of unclear etiology is here for follow-up.  Patient last received IV iron infusions in July 2023. She feels increasingly fatigued. No energy. Short of breath at times. Denies black or bloody stool. Unable to take oral iron d/t intolerance and lack of effect. Has not been taking her b12 shots or thyroid medications.    Review of Systems  Constitutional:  Positive for malaise/fatigue. Negative for chills, diaphoresis, fever and weight loss.  HENT:  Negative for nosebleeds and sore throat.   Eyes:  Negative for double vision.  Respiratory:  Negative for cough, hemoptysis, sputum production, shortness of breath and wheezing.   Cardiovascular:  Negative for chest pain, palpitations, orthopnea and leg swelling.  Gastrointestinal:  Negative for abdominal pain, blood in stool, constipation, diarrhea, heartburn, melena, nausea and vomiting.  Genitourinary:  Negative for dysuria, frequency and urgency.  Musculoskeletal:  Negative for back pain, falls and joint pain.  Skin: Negative.  Negative for itching and rash.       Dry skin & hair  Neurological:  Negative for dizziness, tingling, focal weakness, weakness and headaches.  Endo/Heme/Allergies:  Does not bruise/bleed easily.  Psychiatric/Behavioral:  Negative for depression. The patient is not  nervous/anxious and does not have insomnia.        Flat feeling    MEDICAL HISTORY:  Patient Active Problem List   Diagnosis Date Noted   Pain 02/02/2023   Primary hypothyroidism 02/02/2023   Other fatigue 02/02/2023   Elevated LFTs 05/30/2019   Hepatomegaly 05/30/2019   Iron deficiency anemia due to chronic blood loss 12/27/2017   History reviewed. No pertinent past medical history.  SURGICAL HISTORY: History reviewed. No pertinent surgical history.  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    Social Determinants of Health   Financial Resource Strain: Low Risk  (12/27/2017)   Overall Financial Resource Strain (CARDIA)    Difficulty of Paying Living Expenses: Not hard at all  Food Insecurity: No Food Insecurity (12/27/2017)   Hunger Vital Sign    Worried About Running Out of Food in the Last Year: Never true    Ran Out of Food in the Last Year: Never true  Transportation Needs: No Transportation Needs (12/27/2017)   PRAPARE - Administrator, Civil Service (Medical): No  Lack of Transportation (Non-Medical): No  Physical Activity: Not on file  Stress: Stress Concern Present (12/27/2017)   [employer] of Occupational Health - Occupational Stress Questionnaire    Feeling of Stress : Rather much  Social Connections: Not on file  Intimate Partner Violence: Not on file    FAMILY HISTORY:  Family History  Problem Relation Age of Onset   Cancer Maternal  Uncle        Prostate cancer   Cancer Paternal Uncle        Prostate cancer    ALLERGIES:  is allergic to codeine.  MEDICATIONS:  Current Outpatient Medications  Medication Sig Dispense Refill   celecoxib (CELEBREX) 100 MG capsule Take 1 capsule (100 mg total) by mouth 2 (two) times daily. 60 capsule 1   cyanocobalamin (VITAMIN B12) 1000 MCG/ML injection Inject 1 mL (1,000 mcg total) into the muscle every 30 (thirty) days. 3 mL 3   gabapentin (NEURONTIN) 300 MG capsule Take 300 mg by mouth daily.     hydrochlorothiazide (MICROZIDE) 12.5 MG capsule TAKE 1 CAPSULE (12.5 MG TOTAL) BY MOUTH DAILY AS NEEDED (FOR SWELLING). 90 capsule 1   ibuprofen (ADVIL,MOTRIN) 800 MG tablet Take 800 mg by mouth as needed.     levothyroxine (SYNTHROID) 125 MCG tablet Take 125 mcg by mouth daily.     VICTOZA 18 MG/3ML SOPN Inject 2.4 mg into the skin daily. (Patient not taking: Reported on 08/07/2023)     No current facility-administered medications for this visit.    PHYSICAL EXAMINATION: ECOG PERFORMANCE STATUS: 1 - Symptomatic but completely ambulatory  Vitals:   08/07/23 1358  BP: 108/81  Pulse: 68  Resp: 20  Temp: 98.6 F (37 C)  SpO2: 100%   Filed Weights   08/07/23 1358  Weight: 204 lb 9.6 oz (92.8 kg)   Physical Exam Vitals reviewed.  Constitutional:      Appearance: She is not ill-appearing.  HENT:     Head: Normocephalic and atraumatic.  Pulmonary:     Effort: No respiratory distress.     Breath sounds: No wheezing.  Abdominal:     General: There is no distension.     Palpations: Abdomen is soft.  Skin:    General: Skin is warm.     Coloration: Skin is not pale.  Neurological:     Mental Status: She is alert and oriented to person, place, and time.  Psychiatric:        Mood and Affect: Mood and affect normal.        Behavior: Behavior normal.     LABORATORY DATA:  I have reviewed the data as listed Lab Results  Component Value Date   WBC 6.6 08/07/2023   HGB 13.6  08/07/2023   HCT 40.9 08/07/2023   MCV 91.9 08/07/2023   PLT 368 08/07/2023   Recent Labs    11/30/22 1400 02/02/23 1408 06/07/23 1159 08/07/23 1344  NA 137 141 141 135  K 3.4* 4.4 4.6 4.1  CL 104 104 103 100  CO2 22 20 23 27   GLUCOSE 93 80 73 89  BUN 21* 12 17 16   CREATININE 0.99 1.10* 1.13* 1.14*  CALCIUM 9.1 9.6 9.7 8.9  GFRNONAA >60  --   --  57*  PROT  --  7.7 7.4  --   ALBUMIN  --  4.5 4.3  --   AST  --  10 11  --   ALT  --  13 15  --   ALKPHOS  --  93 130*  --   BILITOT  --  1.2 0.8  --    Iron/TIBC/Ferritin/ %Sat    Component Value Date/Time   IRON 102 06/07/2023 1159   TIBC 276 06/07/2023 1159   FERRITIN 54 06/07/2023 1159   IRONPCTSAT 37 06/07/2023 1159     RADIOGRAPHIC STUDIES: I have personally reviewed the radiological images as listed and agreed with the findings in the report. No results found.  ASSESSMENT & PLAN:   # Iron deficiency anemia-unclear etiology-today hemoglobin is 13.6. Hold venofer today. Iron studies pending. Symptomatic but question if related to other medical comorbidities. Source of IDA unclear. Per patient, long hx of IDA. No menorrhagia or GI losses clinically. Declined GI workup. If counts drop again, would recommend GI.      # Fatigue- TSH was abnormally elevated in August with pcp. Poor compliance with levothyroxine. Additionally, b12 and vit d low. Not on supplementation. Recommend workup and management with PCP. Recommend compliance with medications. OSA ?- defer to PCP re: sleep study.     # DISPOSITION:  Hold venofer 6 mo- labs (cbc, bmp, ferritin, iron studies, b12) Day to week later- see Dr Donneta Romberg, +/- venofer- la  No problem-specific Assessment & Plan notes found for this encounter.  All questions were answered. The patient knows to call the clinic with any problems, questions or concerns.    Alinda Dooms, NP 08/07/2023

## 2023-09-01 ENCOUNTER — Ambulatory Visit: Payer: 59 | Admitting: Family

## 2023-09-12 ENCOUNTER — Other Ambulatory Visit: Payer: Self-pay | Admitting: Family

## 2024-01-29 ENCOUNTER — Encounter: Payer: Self-pay | Admitting: Internal Medicine

## 2024-02-02 ENCOUNTER — Other Ambulatory Visit: Payer: Self-pay | Admitting: *Deleted

## 2024-02-02 DIAGNOSIS — E538 Deficiency of other specified B group vitamins: Secondary | ICD-10-CM

## 2024-02-02 DIAGNOSIS — D509 Iron deficiency anemia, unspecified: Secondary | ICD-10-CM

## 2024-02-05 ENCOUNTER — Inpatient Hospital Stay: Payer: 59

## 2024-02-12 ENCOUNTER — Ambulatory Visit: Payer: 59

## 2024-02-12 ENCOUNTER — Ambulatory Visit: Payer: 59 | Admitting: Internal Medicine

## 2024-02-27 ENCOUNTER — Encounter: Payer: Self-pay | Admitting: Family Medicine

## 2024-04-03 ENCOUNTER — Other Ambulatory Visit: Payer: Self-pay

## 2024-04-03 DIAGNOSIS — Z1231 Encounter for screening mammogram for malignant neoplasm of breast: Secondary | ICD-10-CM

## 2024-04-15 ENCOUNTER — Ambulatory Visit: Payer: Self-pay
